# Patient Record
Sex: Female | Born: 1965 | Race: White | Hispanic: No | Marital: Single | Smoking: Never smoker
Health system: Southern US, Community
[De-identification: ages and names within clinical notes are randomized; demographics above are authoritative.]

## PROBLEM LIST (undated history)

## (undated) DIAGNOSIS — Q048 Other specified congenital malformations of brain: Secondary | ICD-10-CM

## (undated) DIAGNOSIS — E785 Hyperlipidemia, unspecified: Secondary | ICD-10-CM

## (undated) DIAGNOSIS — I2699 Other pulmonary embolism without acute cor pulmonale: Secondary | ICD-10-CM

## (undated) DIAGNOSIS — I272 Pulmonary hypertension, unspecified: Secondary | ICD-10-CM

## (undated) DIAGNOSIS — R569 Unspecified convulsions: Secondary | ICD-10-CM

## (undated) DIAGNOSIS — I5189 Other ill-defined heart diseases: Secondary | ICD-10-CM

## (undated) HISTORY — PX: HEMORRHOID SURGERY: SHX153

---

## 2012-04-11 ENCOUNTER — Ambulatory Visit (HOSPITAL_COMMUNITY): Admission: RE | Admit: 2012-04-11 | Payer: Self-pay | Source: Ambulatory Visit

## 2012-04-12 ENCOUNTER — Other Ambulatory Visit (HOSPITAL_COMMUNITY): Payer: Self-pay | Admitting: Family Medicine

## 2012-04-12 ENCOUNTER — Ambulatory Visit (HOSPITAL_COMMUNITY)
Admission: RE | Admit: 2012-04-12 | Discharge: 2012-04-12 | Disposition: A | Discharge status: Home / Self Care | Payer: Medicare Other | Source: Ambulatory Visit | Attending: Family Medicine | Admitting: Family Medicine

## 2012-04-12 DIAGNOSIS — R600 Localized edema: Secondary | ICD-10-CM

## 2012-04-12 DIAGNOSIS — M7989 Other specified soft tissue disorders: Secondary | ICD-10-CM | POA: Insufficient documentation

## 2012-04-12 DIAGNOSIS — M79609 Pain in unspecified limb: Secondary | ICD-10-CM | POA: Insufficient documentation

## 2012-04-12 DIAGNOSIS — M25561 Pain in right knee: Secondary | ICD-10-CM

## 2012-04-12 NOTE — Progress Notes (Signed)
VASCULAR LAB PRELIMINARY  PRELIMINARY  PRELIMINARY  PRELIMINARY  Right lower extremity venous Doppler completed.    Preliminary report:  There is no DVT or SVT noted in the right lower extremity.  Amber Warner, RVT 04/12/2012, 11:16 AM

## 2013-05-20 ENCOUNTER — Inpatient Hospital Stay (HOSPITAL_COMMUNITY)
Admission: EM | Admit: 2013-05-20 | Discharge: 2013-05-25 | DRG: 175 | Disposition: A | Discharge status: Home / Self Care | Payer: Medicare Other | Attending: Internal Medicine | Admitting: Internal Medicine

## 2013-05-20 ENCOUNTER — Encounter (HOSPITAL_COMMUNITY): Payer: Self-pay | Admitting: Emergency Medicine

## 2013-05-20 ENCOUNTER — Emergency Department (HOSPITAL_COMMUNITY): Payer: Medicare Other

## 2013-05-20 ENCOUNTER — Observation Stay (HOSPITAL_COMMUNITY): Payer: Medicare Other

## 2013-05-20 DIAGNOSIS — E785 Hyperlipidemia, unspecified: Secondary | ICD-10-CM | POA: Diagnosis present

## 2013-05-20 DIAGNOSIS — E876 Hypokalemia: Secondary | ICD-10-CM

## 2013-05-20 DIAGNOSIS — Z6841 Body Mass Index (BMI) 40.0 and over, adult: Secondary | ICD-10-CM | POA: Diagnosis not present

## 2013-05-20 DIAGNOSIS — I2699 Other pulmonary embolism without acute cor pulmonale: Principal | ICD-10-CM

## 2013-05-20 DIAGNOSIS — N390 Urinary tract infection, site not specified: Secondary | ICD-10-CM

## 2013-05-20 DIAGNOSIS — R079 Chest pain, unspecified: Secondary | ICD-10-CM | POA: Diagnosis present

## 2013-05-20 DIAGNOSIS — Z8249 Family history of ischemic heart disease and other diseases of the circulatory system: Secondary | ICD-10-CM

## 2013-05-20 DIAGNOSIS — R55 Syncope and collapse: Secondary | ICD-10-CM | POA: Diagnosis present

## 2013-05-20 DIAGNOSIS — J9600 Acute respiratory failure, unspecified whether with hypoxia or hypercapnia: Secondary | ICD-10-CM

## 2013-05-20 DIAGNOSIS — I2789 Other specified pulmonary heart diseases: Secondary | ICD-10-CM | POA: Diagnosis not present

## 2013-05-20 DIAGNOSIS — R748 Abnormal levels of other serum enzymes: Secondary | ICD-10-CM | POA: Diagnosis present

## 2013-05-20 DIAGNOSIS — I5189 Other ill-defined heart diseases: Secondary | ICD-10-CM

## 2013-05-20 DIAGNOSIS — I272 Pulmonary hypertension, unspecified: Secondary | ICD-10-CM

## 2013-05-20 DIAGNOSIS — Q048 Other specified congenital malformations of brain: Secondary | ICD-10-CM

## 2013-05-20 DIAGNOSIS — G40909 Epilepsy, unspecified, not intractable, without status epilepticus: Secondary | ICD-10-CM

## 2013-05-20 HISTORY — DX: Pulmonary hypertension, unspecified: I27.20

## 2013-05-20 HISTORY — DX: Other pulmonary embolism without acute cor pulmonale: I26.99

## 2013-05-20 HISTORY — DX: Unspecified convulsions: R56.9

## 2013-05-20 HISTORY — DX: Other specified congenital malformations of brain: Q04.8

## 2013-05-20 HISTORY — DX: Other ill-defined heart diseases: I51.89

## 2013-05-20 HISTORY — DX: Hyperlipidemia, unspecified: E78.5

## 2013-05-20 LAB — URINALYSIS, ROUTINE W REFLEX MICROSCOPIC
Bilirubin Urine: NEGATIVE
GLUCOSE, UA: NEGATIVE mg/dL
KETONES UR: NEGATIVE mg/dL
LEUKOCYTES UA: NEGATIVE
Nitrite: POSITIVE — AB
PH: 5.5 (ref 5.0–8.0)
PROTEIN: 30 mg/dL — AB
Specific Gravity, Urine: >1.03 — ABNORMAL HIGH (ref 1.005–1.030)
Urobilinogen, UA: 0.2 mg/dL (ref 0.0–1.0)

## 2013-05-20 LAB — TROPONIN I: TROPONIN I: 0.36 ng/mL — AB (ref <0.30)

## 2013-05-20 LAB — URINE MICROSCOPIC-ADD ON

## 2013-05-20 LAB — CBC WITH DIFFERENTIAL/PLATELET
Basophils Absolute: 0.1 10*3/uL (ref 0.0–0.1)
Basophils Relative: 1 % (ref 0–1)
EOS PCT: 2 % (ref 0–5)
Eosinophils Absolute: 0.2 10*3/uL (ref 0.0–0.7)
HCT: 44.2 % (ref 36.0–46.0)
Hemoglobin: 14.7 g/dL (ref 12.0–15.0)
LYMPHS ABS: 1.3 10*3/uL (ref 0.7–4.0)
LYMPHS PCT: 12 % (ref 12–46)
MCH: 32 pg (ref 26.0–34.0)
MCHC: 33.3 g/dL (ref 30.0–36.0)
MCV: 96.1 fL (ref 78.0–100.0)
Monocytes Absolute: 0.6 10*3/uL (ref 0.1–1.0)
Monocytes Relative: 6 % (ref 3–12)
NEUTROS ABS: 8.8 10*3/uL — AB (ref 1.7–7.7)
NEUTROS PCT: 79 % — AB (ref 43–77)
PLATELETS: 170 10*3/uL (ref 150–400)
RBC: 4.6 MIL/uL (ref 3.87–5.11)
RDW: 13 % (ref 11.5–15.5)
WBC: 11 10*3/uL — AB (ref 4.0–10.5)

## 2013-05-20 LAB — BASIC METABOLIC PANEL
BUN: 21 mg/dL (ref 6–23)
CHLORIDE: 106 meq/L (ref 96–112)
CO2: 24 meq/L (ref 19–32)
Calcium: 8.9 mg/dL (ref 8.4–10.5)
Creatinine, Ser: 0.65 mg/dL (ref 0.50–1.10)
GFR calc Af Amer: >90 mL/min (ref >90)
GFR calc non Af Amer: >90 mL/min (ref >90)
GLUCOSE: 129 mg/dL — AB (ref 70–99)
POTASSIUM: 4.1 meq/L (ref 3.7–5.3)
SODIUM: 144 meq/L (ref 137–147)

## 2013-05-20 LAB — LIPID PANEL
CHOLESTEROL: 200 mg/dL (ref 0–200)
HDL: 36 mg/dL — AB (ref >39)
LDL Cholesterol: 135 mg/dL — ABNORMAL HIGH (ref 0–99)
TRIGLYCERIDES: 144 mg/dL (ref <150)
Total CHOL/HDL Ratio: 5.6 RATIO
VLDL: 29 mg/dL (ref 0–40)

## 2013-05-20 LAB — CK TOTAL AND CKMB (NOT AT ARMC)
CK, MB: 14 ng/mL (ref 0.3–4.0)
Relative Index: 11.7 — ABNORMAL HIGH (ref 0.0–2.5)
Total CK: 120 U/L (ref 7–177)

## 2013-05-20 LAB — D-DIMER, QUANTITATIVE (NOT AT ARMC): D DIMER QUANT: 8.33 ug{FEU}/mL — AB (ref 0.00–0.48)

## 2013-05-20 LAB — CARBAMAZEPINE LEVEL, TOTAL: Carbamazepine Lvl: 10 ug/mL (ref 4.0–12.0)

## 2013-05-20 MED ORDER — ACETAMINOPHEN 650 MG RE SUPP
650.0000 mg | Freq: Four times a day (QID) | RECTAL | Status: DC | PRN
Start: 2013-05-20 — End: 2013-05-25

## 2013-05-20 MED ORDER — IOHEXOL 350 MG/ML SOLN
100.0000 mL | Freq: Once | INTRAVENOUS | Status: AC | PRN
  Administered 2013-05-20: 100 mL via INTRAVENOUS

## 2013-05-20 MED ORDER — CELECOXIB 100 MG PO CAPS
200.0000 mg | ORAL_CAPSULE | Freq: Two times a day (BID) | ORAL | Status: DC | PRN
  Administered 2013-05-21 – 2013-05-22 (×3): 200 mg via ORAL
  Filled 2013-05-20: qty 2
  Filled 2013-05-20: qty 1
  Filled 2013-05-20: qty 2

## 2013-05-20 MED ORDER — SODIUM CHLORIDE 0.9 % IV BOLUS (SEPSIS)
1000.0000 mL | Freq: Once | INTRAVENOUS | Status: AC
  Administered 2013-05-20: 1000 mL via INTRAVENOUS

## 2013-05-20 MED ORDER — ENOXAPARIN SODIUM 60 MG/0.6ML ~~LOC~~ SOLN
1.0000 mg/kg | Freq: Once | SUBCUTANEOUS | Status: AC
  Administered 2013-05-20: 110 mg via SUBCUTANEOUS
  Filled 2013-05-20: qty 1.2

## 2013-05-20 MED ORDER — SODIUM CHLORIDE 0.9 % IJ SOLN
3.0000 mL | Freq: Two times a day (BID) | INTRAMUSCULAR | Status: DC
  Administered 2013-05-23 – 2013-05-24 (×4): 3 mL via INTRAVENOUS

## 2013-05-20 MED ORDER — CARBAMAZEPINE 200 MG PO TABS
200.0000 mg | ORAL_TABLET | Freq: Four times a day (QID) | ORAL | Status: DC
  Administered 2013-05-20 – 2013-05-21 (×2): 200 mg via ORAL
  Administered 2013-05-21: 300 mg via ORAL
  Administered 2013-05-21 – 2013-05-25 (×16): 200 mg via ORAL
  Filled 2013-05-20 (×29): qty 1

## 2013-05-20 MED ORDER — ATORVASTATIN CALCIUM 40 MG PO TABS
80.0000 mg | ORAL_TABLET | Freq: Every day | ORAL | Status: DC
  Administered 2013-05-20 – 2013-05-24 (×5): 80 mg via ORAL
  Filled 2013-05-20 (×5): qty 2

## 2013-05-20 MED ORDER — POTASSIUM CHLORIDE CRYS ER 20 MEQ PO TBCR
20.0000 meq | EXTENDED_RELEASE_TABLET | Freq: Every day | ORAL | Status: DC
  Administered 2013-05-21 – 2013-05-25 (×5): 20 meq via ORAL
  Filled 2013-05-20 (×5): qty 1

## 2013-05-20 MED ORDER — CARBAMAZEPINE 100 MG/5ML PO SUSP
100.0000 mg | ORAL | Status: DC
  Administered 2013-05-21 – 2013-05-25 (×9): 100 mg via ORAL
  Filled 2013-05-20 (×14): qty 5

## 2013-05-20 MED ORDER — ENOXAPARIN SODIUM 60 MG/0.6ML ~~LOC~~ SOLN: 1.0000 mg/kg | Freq: Once | SUBCUTANEOUS | Status: DC

## 2013-05-20 MED ORDER — SODIUM CHLORIDE 0.9 % IV SOLN
INTRAVENOUS | Status: AC
  Administered 2013-05-21 (×2): via INTRAVENOUS

## 2013-05-20 MED ORDER — ENOXAPARIN SODIUM 40 MG/0.4ML ~~LOC~~ SOLN: 40.0000 mg | SUBCUTANEOUS | Status: DC

## 2013-05-20 MED ORDER — ACETAMINOPHEN 325 MG PO TABS
650.0000 mg | ORAL_TABLET | Freq: Four times a day (QID) | ORAL | Status: DC | PRN
  Administered 2013-05-21 – 2013-05-23 (×2): 650 mg via ORAL
  Filled 2013-05-20 (×3): qty 2

## 2013-05-20 MED ORDER — ASPIRIN EC 325 MG PO TBEC
325.0000 mg | DELAYED_RELEASE_TABLET | Freq: Every day | ORAL | Status: DC | PRN
  Administered 2013-05-21: 325 mg via ORAL
  Filled 2013-05-20: qty 1

## 2013-05-20 MED ORDER — ADULT MULTIVITAMIN W/MINERALS CH
1.0000 | ORAL_TABLET | Freq: Every day | ORAL | Status: DC
  Administered 2013-05-21 – 2013-05-25 (×5): 1 via ORAL
  Filled 2013-05-20 (×5): qty 1

## 2013-05-20 MED ORDER — SODIUM CHLORIDE 0.9 % IJ SOLN
INTRAMUSCULAR | Status: AC
  Filled 2013-05-20: qty 600

## 2013-05-20 MED ORDER — ASPIRIN EC 325 MG PO TBEC
325.0000 mg | DELAYED_RELEASE_TABLET | Freq: Every day | ORAL | Status: DC
  Administered 2013-05-20 – 2013-05-25 (×6): 325 mg via ORAL
  Filled 2013-05-20 (×6): qty 1

## 2013-05-20 NOTE — Progress Notes (Signed)
Radiologist called with results of CT, Pt has bilateral PEs. Covering hospitalist paged. Lipitor, aspirin, and lovenox given when patient arrived back to her room.

## 2013-05-20 NOTE — ED Notes (Signed)
cbg 114 per ems

## 2013-05-20 NOTE — ED Notes (Signed)
EMS reports was called out for CPR.  [facility] says pt was walking to her car with her mother, didn't feel good and collapsed in drive way.  EMS says when rescue squad arrived, pt's mother was administering CPR, pt appeared cyanotic, RR 3-4 and had weak pulse.  When EMS arrived pt was sitting up alert, talking, diaphoretic, and incontinent of urine.  BP initially was 100 palpated, ems reports bp was 148/77 and HR 110-120.  Reports pt has history of epilepsy and sunlight triggers her seizures.   Pt c/o mid back pain.  [facility] has chronic back pain but thinks aggravated it when she fell.

## 2013-05-20 NOTE — Progress Notes (Addendum)
Troponin came back at .36. Ddimer elevated over 8. CTA chest ordered. EKG f/up ordered with ? changes over earlier one today. Dr. Pearson GrippeJames Kim [facility] to relate lab/EKG results. He will take care of further orders.  Amber NormanKaren Kirby-Graham, NP [name] Update: CTA chest came back + for PE with right heart strain. Discussed with Dr. Selena BattenKim. Pt received one treatment dose of Lovenox and ASA per Dr. Selena BattenKim. Placed order for pharmacy consult for Lovenox dosing. Pt transferred to SDU for closer monitoring.  KJKG, NP

## 2013-05-20 NOTE — H&P (Signed)
Amber Warner is an 48 y.o. female.    Pcp:  Dr. Edrick Oh [facility], [place]) Full Code  Chief Complaint: syncope HPI: 48 yo female with seizure on Friday c/o " suction on her heart" left side of chest, and then this am felt a similar feeling, and then walking to the [place] and fell forward first and then backwards, per her mother she was sob, and blue.  Her mother initiated cpr on her.  This was not similar to prior seizure according to her mother. Cpr for about 57mnute.  Eyes were rolled back in her head per mother.  Then paramedics arrived and gave her o2 and she did better per her mother.     In ED, CT brain negative for any acute process.  EKG: nsr at 100, t inversion in V4-6, no old ekg for comparison.  Cardiac marker not yet resulted.  Pt will be admitted for syncope, and chestpain    Past Medical History  Diagnosis Date  . Seizures     Past Surgical History  Procedure Laterality Date  . Hemorrhoid surgery      Family History  Problem Relation Age of Onset  . Hypertension Mother   . Diabetes Mellitus II Maternal Aunt   . Stroke Father   . Heart attack Father 532   died at age 48  Social History:  reports that she has never smoked. She has never used smokeless tobacco. She reports that she does not drink alcohol or use illicit drugs.  Allergies: No Known Allergies  Medications Prior to Admission  Medication Sig Dispense Refill  . aspirin 500 MG EC tablet Take 500 mg by mouth daily as needed for pain.      . carBAMazepine (TEGRETOL) 100 MG/5ML suspension Take 100 mg by mouth 2 (two) times daily.      . carbamazepine (TEGRETOL) 200 MG tablet Take 200 mg by mouth 4 (four) times daily.      . celecoxib (CELEBREX) 200 MG capsule Take 200 mg by mouth 2 (two) times daily as needed for moderate pain.      . Multiple Vitamin (MULTIVITAMIN WITH MINERALS) TABS tablet Take 1 tablet by mouth daily.      . potassium chloride SA (K-DUR,KLOR-CON) 20 MEQ tablet Take 20 mEq by mouth daily.         Results for orders placed during the hospital encounter of 05/20/13 (from the past 48 hour(s))  CBC WITH DIFFERENTIAL     Status: Abnormal   Collection Time    05/20/13  1:51 PM      Result Value Ref Range   WBC 11.0 (*) 4.0 - 10.5 K/uL   RBC 4.60  3.87 - 5.11 MIL/uL   Hemoglobin 14.7  12.0 - 15.0 g/dL   HCT 44.2  36.0 - 46.0 %   MCV 96.1  78.0 - 100.0 fL   MCH 32.0  26.0 - 34.0 pg   MCHC 33.3  30.0 - 36.0 g/dL   RDW 13.0  11.5 - 15.5 %   Platelets 170  150 - 400 K/uL   Neutrophils Relative % 79 (*) 43 - 77 %   Neutro Abs 8.8 (*) 1.7 - 7.7 K/uL   Lymphocytes Relative 12  12 - 46 %   Lymphs Abs 1.3  0.7 - 4.0 K/uL   Monocytes Relative 6  3 - 12 %   Monocytes Absolute 0.6  0.1 - 1.0 K/uL   Eosinophils Relative 2  0 - 5 %  Eosinophils Absolute 0.2  0.0 - 0.7 K/uL   Basophils Relative 1  0 - 1 %   Basophils Absolute 0.1  0.0 - 0.1 K/uL  BASIC METABOLIC PANEL     Status: Abnormal   Collection Time    05/20/13  1:51 PM      Result Value Ref Range   Sodium 144  137 - 147 mEq/L   Potassium 4.1  3.7 - 5.3 mEq/L   Chloride 106  96 - 112 mEq/L   CO2 24  19 - 32 mEq/L   Glucose, Bld 129 (*) 70 - 99 mg/dL   BUN 21  6 - 23 mg/dL   Creatinine, Ser 0.65  0.50 - 1.10 mg/dL   Calcium 8.9  8.4 - 10.5 mg/dL   GFR calc non Af Amer >90  >90 mL/min   GFR calc Af Amer >90  >90 mL/min   Comment: (NOTE)     The eGFR has been calculated using the CKD EPI equation.     This calculation has not been validated in all clinical situations.     eGFR's persistently <90 mL/min signify possible Chronic Kidney     Disease.  CARBAMAZEPINE LEVEL, TOTAL     Status: None   Collection Time    05/20/13  1:51 PM      Result Value Ref Range   Carbamazepine Lvl 10.0  4.0 - 12.0 ug/mL   Ct Head Wo Contrast  05/20/2013   CLINICAL DATA:  Seizure, fall  EXAM: CT HEAD WITHOUT CONTRAST  TECHNIQUE: Contiguous axial images were obtained from the base of the skull through the vertex without intravenous contrast.   COMPARISON:  Prior CT from 07/01/2006.  Marland Kitchen  FINDINGS: Scattered and confluent hypodensity within the periventricular and deep white matter both cerebral hemispheres is consistent with mild chronic small vessel ischemic changes. Findings are most evident adjacent to the frontal horn of the right lateral ventricle. Increased nodularity along the margins of the lateral ventricles is suggestive of heterotopic gray matter (series 2, image 21).  There is no acute intracranial hemorrhage or infarct. No mass lesion or midline shift. Gray-white matter differentiation is well maintained. Ventricles are normal in size without evidence of hydrocephalus. CSF containing spaces are within normal limits. No extra-axial fluid collection.  The calvarium is intact.  Orbital soft tissues are within normal limits.  The paranasal sinuses and mastoid air cells are well pneumatized and free of fluid.  Scalp soft tissues are unremarkable.  IMPRESSION: 1. No acute intracranial abnormality. 2. 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 This finding is unchanged relative to prior CT from 06/30/2004. Follow-up examination with MRI would likely be helpful for further evaluation if further imaging is desired. 3. Moderate chronic small vessel ischemic disease.   Electronically Signed   By: Jeannine Boga M.D.   On: 05/20/2013 16:08   Dg Chest Portable 1 View  05/20/2013   CLINICAL DATA:  Loss of consciousness, patient's of breathing according to the mother, history of seizures  EXAM: PORTABLE CHEST - 1 VIEW  COMPARISON:  None.  FINDINGS: The heart size and mediastinal contours are within normal limits. There is prominence of the right hilum likely representing prominent pulmonary vasculature. Both lungs are clear. The visualized skeletal structures are unremarkable.  IMPRESSION: No active disease.   Electronically Signed   By: Kathreen Devoid    On: 05/20/2013 14:05    ROS  Negative for  all organ systems except for + above,  No tongue lac, no incontinence  Blood pressure 106/63, pulse 87, temperature 97.7 F (36.5 C), temperature source Oral, resp. rate 22, last menstrual period 04/24/2013, SpO2 95.00%. Physical Exam   Heent: anicteric, pupiles 1.79m symmetric, direct, consensual near intact,  Neck: no jvd, no bruit, no tm,  Heart: rrr s1, s2 Lung : ctab Abd: soft, obese, nt, nd, +bs,  Ext: no c/c/e Skin: no rash Lymph: no adenopathy Neuro: nonfocal Reflexes 2+ symmetric, direct consensual near intact    Assessment/Plan  Syncope MRI, EEG Carotid u/s, cardiac echo Check D dimer , if + then CTA chest Check ua  Chest pain Telemetry Cycle cardiac markers Stress testing  Seizure disorder Check cmp, cont tegretol  Hypokalemia Continue potassium supplementation  JJani Gravel4/27/2015, 8:03 PM

## 2013-05-20 NOTE — Progress Notes (Signed)
Lab called critical troponin, I paged [facility] night coverage. Pt denies chest pain, shortness of breath, or dizziness. Will page MD again.

## 2013-05-20 NOTE — ED Provider Notes (Signed)
CSN: [id number]     Arrival date & time 05/20/13  1321 History   This chart was scribed for Wilson SingerNimish C Gosrani, MD by Tana ConchStephen Methvin, ED Scribe. This patient was seen in room APA07/APA07 and the patient's care was started at 1:32 PM .    Chief Complaint  Patient presents with  . Loss of Consciousness      The history is provided by the patient and a parent. No language interpreter was used.    HPI Comments: Level V caveat for urgent need for intervention Amber Warner is a 48 y.o. female who presents to the Emergency Department complaining of LOC that happened this morning when she and her mother were going to get something to eat . Her mother states that she had to "grab the car as she could not breathe" .  The patient fell to the ground.   Her mother  had to perform "CPR" described as chest compression for approximately 1 minute before the pt regained consciousness. When the pt came to,  mother reports t pt had  shallow respirations and seemed confused.  She also reports  back pain.   Past medical history positive for a seizure disorder for which she takes Tegretol   LEVEL 5 CAVEAT CONFUSION  Past Medical History  Diagnosis Date  . Seizures    History reviewed. No pertinent past surgical history. No family history on file. History  Substance Use Topics  . Smoking status: Never Smoker   . Smokeless tobacco: Not on file  . Alcohol Use: No   OB History   Grav Para Term Preterm Abortions TAB SAB Ect Mult Living                 Review of Systems  Respiratory: Positive for shortness of breath.   Cardiovascular: Positive for chest pain.  Neurological: Positive for syncope and light-headedness.    A complete 10 system review of systems was obtained and all systems are negative except as noted in the HPI and PMH.    Allergies  Review of patient's allergies indicates no known allergies.  Home Medications   Prior to Admission medications   Not on File   BP 109/61   Pulse 113  Temp(Src) 97.7 F (36.5 C) (Oral)  Resp 21  SpO2 96%  LMP 04/24/2013 Physical Exam  Nursing note and vitals reviewed. Constitutional: She is oriented to person, place, and time. She appears well-developed and well-nourished.  HENT:  Head: Normocephalic and atraumatic.  Eyes: Conjunctivae and EOM are normal. Pupils are equal, round, and reactive to light.  Neck: Normal range of motion. Neck supple.  Cardiovascular: Normal rate, regular rhythm and normal heart sounds.   Pulmonary/Chest: Effort normal and breath sounds normal.  Abdominal: Soft. Bowel sounds are normal.  Musculoskeletal: Normal range of motion.  Neurological: She is alert and oriented to person, place, and time. No cranial nerve deficit. Coordination normal.  Skin: Skin is warm and dry.  Psychiatric: She has a normal mood and affect. Her behavior is normal.    ED Course  Procedures (including critical care time) ,DIAGNOSTIC STUDIES: Oxygen Saturation is 96% on RA, normal by my interpretation.    COORDINATION OF CARE:   1:38 PM-Discussed treatment plan which includes labs, pt will be admitted with pt at bedside and pt agreed to plan.   Labs Review Labs Reviewed  CBC WITH DIFFERENTIAL - Abnormal; Notable for the following:    WBC 11.0 (*)    Neutrophils Relative %  79 (*)    Neutro Abs 8.8 (*)    All other components within normal limits  BASIC METABOLIC PANEL - Abnormal; Notable for the following:    Glucose, Bld 129 (*)    All other components within normal limits  CARBAMAZEPINE LEVEL, TOTAL  URINALYSIS, ROUTINE W REFLEX MICROSCOPIC    Imaging Review Ct Head Wo Contrast  05/20/2013   CLINICAL DATA:  Seizure, fall  EXAM: CT HEAD WITHOUT CONTRAST  TECHNIQUE: Contiguous axial images were obtained from the base of the skull through the vertex without intravenous contrast.  COMPARISON:  Prior CT from 07/01/2006.  Marland Kitchen.  FINDINGS: Scattered and confluent hypodensity within the periventricular and deep  white matter both cerebral hemispheres is consistent with mild chronic small vessel ischemic changes. Findings are most evident adjacent to the frontal horn of the right lateral ventricle. Increased nodularity along the margins of the lateral ventricles is suggestive of heterotopic gray matter (series 2, image 21).  There is no acute intracranial hemorrhage or infarct. No mass lesion or midline shift. Gray-white matter differentiation is well maintained. Ventricles are normal in size without evidence of hydrocephalus. CSF containing spaces are within normal limits. No extra-axial fluid collection.  The calvarium is intact.  Orbital soft tissues are within normal limits.  The paranasal sinuses and mastoid air cells are well pneumatized and free of fluid.  Scalp soft tissues are unremarkable.  IMPRESSION: 1. No acute intracranial abnormality. 2. 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 This finding is unchanged relative to prior CT from 06/30/2004. Follow-up examination with MRI would likely be helpful for further evaluation if further imaging is desired. 3. Moderate chronic small vessel ischemic disease.   Electronically Signed   By: Rise MuBenjamin  McClintock M.D.   On: 05/20/2013 16:08   Dg Chest Portable 1 View  05/20/2013   CLINICAL DATA:  Loss of consciousness, patient's of breathing according to the mother, history of seizures  EXAM: PORTABLE CHEST - 1 VIEW  COMPARISON:  None.  FINDINGS: The heart size and mediastinal contours are within normal limits. There is prominence of the right hilum likely representing prominent pulmonary vasculature. Both lungs are clear. The visualized skeletal structures are unremarkable.  IMPRESSION: No active disease.   Electronically Signed   By: Elige KoHetal  Patel   On: 05/20/2013 14:05     EKG Interpretation   Date/Time:  Monday May 20 2013 14:00:14 EDT Ventricular Rate:  100 PR  Interval:  144 QRS Duration: 108 QT Interval:  388 QTC Calculation: 500 R Axis:   9 Text Interpretation:  Normal sinus rhythm Incomplete right bundle branch  block Anterior infarct , age undetermined T wave abnormality, consider  inferolateral ischemia Prolonged QT Abnormal ECG No previous ECGs  available Confirmed by Adriana SimasOOK  MD, Romone Shaff ([id number]) on 05/20/2013 3:23:31 PM      MDM   Final diagnoses:  Syncope   I personally performed the services described in this documentation, which was scribed in my presence. The recorded information has been reviewed and is accurate.   Uncertain etiology of syncopal spell and subsequent unresponsiveness. Patient has normal exam here. EKG, CT head, labs all normal. Telemetry    Donnetta HutchingBrian Kahmari Koller, MD 05/20/13 1810

## 2013-05-21 ENCOUNTER — Observation Stay (HOSPITAL_COMMUNITY)
Admit: 2013-05-21 | Discharge: 2013-05-21 | Disposition: A | Discharge status: Home / Self Care | Payer: Medicare Other | Attending: Internal Medicine | Admitting: Internal Medicine

## 2013-05-21 ENCOUNTER — Observation Stay (HOSPITAL_COMMUNITY): Payer: Medicare Other

## 2013-05-21 DIAGNOSIS — J9600 Acute respiratory failure, unspecified whether with hypoxia or hypercapnia: Secondary | ICD-10-CM | POA: Diagnosis present

## 2013-05-21 DIAGNOSIS — I369 Nonrheumatic tricuspid valve disorder, unspecified: Secondary | ICD-10-CM

## 2013-05-21 DIAGNOSIS — I2699 Other pulmonary embolism without acute cor pulmonale: Secondary | ICD-10-CM | POA: Diagnosis present

## 2013-05-21 HISTORY — DX: Other pulmonary embolism without acute cor pulmonale: I26.99

## 2013-05-21 LAB — CBC
HCT: 40.1 % (ref 36.0–46.0)
Hemoglobin: 13.3 g/dL (ref 12.0–15.0)
MCH: 32.1 pg (ref 26.0–34.0)
MCHC: 33.2 g/dL (ref 30.0–36.0)
MCV: 96.9 fL (ref 78.0–100.0)
Platelets: 152 10*3/uL (ref 150–400)
RBC: 4.14 MIL/uL (ref 3.87–5.11)
RDW: 13 % (ref 11.5–15.5)
WBC: 10.8 10*3/uL — ABNORMAL HIGH (ref 4.0–10.5)

## 2013-05-21 LAB — TROPONIN I
Troponin I: <0.3 ng/mL (ref <0.30)
Troponin I: <0.3 ng/mL (ref <0.30)

## 2013-05-21 LAB — MRSA PCR SCREENING: MRSA by PCR: NEGATIVE

## 2013-05-21 MED ORDER — PRO-STAT SUGAR FREE PO LIQD
30.0000 mL | Freq: Three times a day (TID) | ORAL | Status: DC
  Administered 2013-05-21 – 2013-05-25 (×11): 30 mL via ORAL
  Filled 2013-05-21 (×11): qty 30

## 2013-05-21 MED ORDER — DEXTROSE 5 % IV SOLN
1.0000 g | INTRAVENOUS | Status: DC
  Administered 2013-05-21 – 2013-05-24 (×4): 1 g via INTRAVENOUS
  Filled 2013-05-21 (×7): qty 10

## 2013-05-21 MED ORDER — ENOXAPARIN SODIUM 120 MG/0.8ML ~~LOC~~ SOLN
110.0000 mg | Freq: Two times a day (BID) | SUBCUTANEOUS | Status: DC
  Administered 2013-05-21 – 2013-05-22 (×4): 110 mg via SUBCUTANEOUS
  Administered 2013-05-23: 10:00:00 via SUBCUTANEOUS
  Filled 2013-05-21 (×7): qty 0.8

## 2013-05-21 NOTE — Progress Notes (Signed)
ANTICOAGULATION CONSULT NOTE - Initial Consult  Pharmacy Consult for Lovenox Indication: pulmonary embolus  No Known Allergies  Patient Measurements: Height: 5\' 2"  (157.5 cm) Weight: 245 lb 9.6 oz (111.403 kg) IBW/kg (Calculated) : 50.1  Vital Signs: Temp: 98 F (36.7 C) (04/28 0400) Temp src: Oral (04/28 0400) BP: 131/84 mmHg (04/28 0700) Pulse Rate: 97 (04/27 2102)  Labs:  Recent Labs  05/20/13 1351 05/21/13 0216  HGB 14.7  --   HCT 44.2  --   PLT 170  --   CREATININE 0.65  --   CKTOTAL 120  --   CKMB 14.0*  --   TROPONINI 0.36* <0.30    Estimated Creatinine Clearance: 102.4 ml/min (by C-G formula based on Cr of 0.65).   Medical History: Past Medical History  Diagnosis Date  . Seizures     Medications:  Scheduled:  . aspirin EC  325 mg Oral Daily  . atorvastatin  80 mg Oral q1800  . carBAMazepine  100 mg Oral 2 times per day  . carbamazepine  200 mg Oral 4 times per day  . multivitamin with minerals  1 tablet Oral Daily  . potassium chloride SA  20 mEq Oral Daily  . sodium chloride  3 mL Intravenous Q12H  . sodium chloride        Assessment: 48 yo F admitted with syncope & chest pain.  Chest CT + PE.  She was started on Lovenox.  No bleeding noted.  Renal function at patient's baseline.   Goal of Therapy:  Anti-Xa level 0.6-1 units/ml 4hrs after LMWH dose given Monitor platelets by anticoagulation protocol: Yes   Plan:  Lovenox 110mg  sq q12h CBC on MWF F/U long-term anticoagulation plans  Mercy Ridingndrea Taygen Crispin Vogel 05/21/2013,7:42 AM

## 2013-05-21 NOTE — Progress Notes (Addendum)
[name] PROGRESS NOTE  Amber Warner ZOX:[id number] DOB: Nov 22, 1965 DOA: 05/20/2013 PCP: Josue Hector, MD  Summary:  This patient was admitted to the hospital after having a syncopal episode. Patient's mother reports that patient became increasingly short of breath, complained of chest pain and then collapsed. Her mother reports that the patient was becoming cyanotic. Her mother then initiated chest compressions until EMS services arrived approximately 1 minute later. It does not appear that the patient ever truly lost her pulse. Oxygen was applied when EMS arrived and the patient began to improve. She was admitted to the hospital and found to have bilateral pulmonary emboli. She's been started on anticoagulation. Workup for PE has been initiated. She also has a seizure disorder and family reports multiple seizures, 2 seizures since her hospital admission. EEG has been ordered as has neurology consultation.  Assessment/Plan: 1. Bilateral pulmonary embolus. CT angiogram of the chest indicates bilateral pulmonary emboli. Patient is not on any hormonal treatments. Her family does report that she is very sedentary and mostly lays in bed. When the patient leaves her house, her family keeps her in a wheelchair despite her ability to ambulate. They are concerned that she may fall and injure herself after having a seizure. She does not have any prior history of thromboembolism. She's been started on Lovenox per pharmacy. Hypercoagulable panel will be sent. We will check venous Dopplers of lower extremities to rule out DVT. She'll likely benefit from being discharged on Xarelto or Eliquis. She does have evidence of right heart strain on CT. We will check echocardiogram. 2. Acute respiratory failure secondary to #1. Wean oxygen as tolerated. Incentive spirometry. 3. Mildly elevated troponin at 0.3. This is likely a demand ischemia from underlying PE. 4. Seizure disorder. Family reports the  patient has seizures on a daily basis. She takes Tegretol for seizures. She has been resistant in the past to change her antiepileptic regimen. He was reported that patient had 2 seizures while in the hospital since admission. The validity of these seizures is somewhat questionable. Will order EEG and neurology consult. The patient may benefit from adjustment/change in her antiepileptic regimen. 5. Syncope. Patient did become unresponsive after becoming short of breath and having chest pain in the field. Her mother reports that she collapsed and became unresponsive. Her mother began chest compressions until EMS arrived. She did not report check for a pulse prior to initiating chest compressions. After EMS arrival, oxygen was applied and patient became more awake and alert. It does not appear that she had a true cardiac arrest. 6. Possible UTI. Urinalysis indicates nitrite positive urine as well as RBCs in urine. We'll send urine culture and empirically start Rocephin. She also has a mild leukocytosis.  Code Status: full code Family Communication: discussed with patient and mother at the bedside Disposition Plan: discharge home once improved   Consultants:  Neurology  Procedures:    Antibiotics:  Rocephin 4/28  HPI/Subjective: Complains of shortness of breath, pain on deep inspiration and coughing  Objective: Filed Vitals:   05/21/13 1000  BP: 118/79  Pulse:   Temp:   Resp: 25    Intake/Output Summary (Last 24 hours) at 05/21/13 1137 Last data filed at 05/21/13 1000  Gross per 24 hour  Intake    780 ml  Output    400 ml  Net    380 ml   Filed Weights   05/20/13 2102 05/21/13 0045 05/21/13 0431  Weight: 110.7 kg (244 lb 0.8 oz) 111.8 kg (  246 lb 7.6 oz) 111.403 kg (245 lb 9.6 oz)    Exam:   General:  NAD, increased respiratory effort  Cardiovascular: S1, S2 tachycardic  Respiratory: CTA B  Abdomen: soft, obese, nt, bs+  Musculoskeletal: no edema b/l   Data  Reviewed: Basic Metabolic Panel:  Recent Labs Lab 05/20/13 1351  NA 144  K 4.1  CL 106  CO2 24  GLUCOSE 129*  BUN 21  CREATININE 0.65  CALCIUM 8.9   Liver Function Tests: No results found for this basename: AST, ALT, ALKPHOS, BILITOT, PROT, ALBUMIN,  in the last 168 hours No results found for this basename: LIPASE, AMYLASE,  in the last 168 hours No results found for this basename: AMMONIA,  in the last 168 hours CBC:  Recent Labs Lab 05/20/13 1351 05/21/13 0841  WBC 11.0* 10.8*  NEUTROABS 8.8*  --   HGB 14.7 13.3  HCT 44.2 40.1  MCV 96.1 96.9  PLT 170 152   Cardiac Enzymes:  Recent Labs Lab 05/20/13 1351 05/21/13 0216 05/21/13 0841  CKTOTAL 120  --   --   CKMB 14.0*  --   --   TROPONINI 0.36* <0.30 <0.30   BNP (last 3 results) No results found for this basename: PROBNP,  in the last 8760 hours CBG: No results found for this basename: GLUCAP,  in the last 168 hours  Recent Results (from the past 240 hour(s))  MRSA PCR SCREENING     Status: None   Collection Time    05/21/13  3:38 AM      Result Value Ref Range Status   MRSA by PCR NEGATIVE  NEGATIVE Final   Comment:            The GeneXpert MRSA Assay (FDA     approved for NASAL specimens     only), is one component of a     comprehensive MRSA colonization     surveillance program. It is not     intended to diagnose MRSA     infection nor to guide or     monitor treatment for     MRSA infections.     Studies: Ct Head Wo Contrast  05/20/2013   CLINICAL DATA:  Seizure, fall  EXAM: CT HEAD WITHOUT CONTRAST  TECHNIQUE: Contiguous axial images were obtained from the base of the skull through the vertex without intravenous contrast.  COMPARISON:  Prior CT from 07/01/2006.  Marland Kitchen.  FINDINGS: Scattered and confluent hypodensity within the periventricular and deep white matter both cerebral hemispheres is consistent with mild chronic small vessel ischemic changes. Findings are most evident adjacent to the frontal  horn of the right lateral ventricle. Increased nodularity along the margins of the lateral ventricles is suggestive of heterotopic gray matter (series 2, image 21).  There is no acute intracranial hemorrhage or infarct. No mass lesion or midline shift. Gray-white matter differentiation is well maintained. Ventricles are normal in size without evidence of hydrocephalus. CSF containing spaces are within normal limits. No extra-axial fluid collection.  The calvarium is intact.  Orbital soft tissues are within normal limits.  The paranasal sinuses and mastoid air cells are well pneumatized and free of fluid.  Scalp soft tissues are unremarkable.  IMPRESSION: 1. No acute intracranial abnormality. 2. 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 This finding is unchanged relative to prior CT from 06/30/2004. Follow-up examination with MRI would likely be helpful for further evaluation if  further imaging is desired. 3. Moderate chronic small vessel ischemic disease.   Electronically Signed   By: Rise Mu M.D.   On: 05/20/2013 16:08   Ct Angio Chest Pe W/cm &/or Wo Cm  05/20/2013   CLINICAL DATA:  Several day history of chest pain and shortness of breath now with hypoxia and positive D-dimer  EXAM: CT ANGIOGRAPHY CHEST WITH CONTRAST  TECHNIQUE: Multidetector CT imaging of the chest was performed using the standard protocol during bolus administration of intravenous contrast. Multiplanar CT image reconstructions and MIPs were obtained to evaluate the vascular anatomy.  CONTRAST:  OMNIPAQUE IOHEXOL 350 MG/ML SOLN  COMPARISON:  DG CHEST 1V PORT dated 05/20/2013  FINDINGS: There are filling defects within the right and left main pulmonary arteries at their proximal branches consistent with acute pulmonary emboli. There is dilation of the right ventricle with an abnormal RV-LV ratio of 1.6. There is no significant  pericardial effusion and no pleural effusion. There is no mediastinal or hilar lymphadenopathy. The caliber of the thoracic aorta is normal. There is no evidence of a false lumen.  At lung window settings very mildly increased interstitial density is present in both lungs. There is no alveolar infiltrate or findings to suggest pulmonary infarction. No pulmonary parenchymal nodules are demonstrated.  Within the upper abdomen there is an approximately 5 mm diameter calcification in the right hepatic lobe. The observed portions of the spleen and adrenal glands are grossly normal. The  Review of the MIP images confirms the above findings.  IMPRESSION: 1. There are new large volume bilateral pulmonary emboli. There is CTevidence of right heart strain (RV/LV Ratio = 1.6) consistent with at least submassive (intermediate risk) PE. The presence of right heart strain has been associated with an increased risk of morbidity and mortality. 2. There is no CT evidence of acute pulmonary infarction. 3. There is no pleural nor significant pericardial effusion. 4. These results were called by telephone at the time of interpretation on 05/20/2013 at 11:25 PM to Alfonse Flavors, RN, who verbally acknowledged these results.   Electronically Signed   By: David  [place]   On: 05/20/2013 23:28   US Carotid Bilateral  05/21/2013   CLINICAL DATA:  Syncope  EXAM: BILATERAL CAROTID DUPLEX ULTRASOUND  TECHNIQUE: Wallace Cullens scale imaging, color Doppler and duplex ultrasound were performed of bilateral carotid and vertebral arteries in the neck.  COMPARISON:  None.  FINDINGS: Criteria: Quantification of carotid stenosis is based on velocity parameters that correlate the residual internal carotid diameter with NASCET-based stenosis levels, using the diameter of the distal internal carotid lumen as the denominator for stenosis measurement.  The following velocity measurements were obtained:  RIGHT  ICA:  72/29 cm/sec  CCA:  51/11 cm/sec  SYSTOLIC ICA/CCA  RATIO:  1.41  DIASTOLIC ICA/CCA RATIO:  2.50  ECA:  97 cm/sec  LEFT  ICA:  68/22 cm/sec  CCA:  72/12 cm/sec  SYSTOLIC ICA/CCA RATIO:  0.94  DIASTOLIC ICA/CCA RATIO:  1.84  ECA:  65 cm/sec  RIGHT CAROTID ARTERY: Minor echogenic shadowing plaque formation. No hemodynamically significant right ICA stenosis, velocity elevation, or turbulent flow. Degree of narrowing less than 50%.  RIGHT VERTEBRAL ARTERY:  Antegrade  LEFT CAROTID ARTERY: Similar scattered minor echogenic plaque formation. No hemodynamically significant left ICA stenosis, velocity elevation, or turbulent flow.  LEFT VERTEBRAL ARTERY:  Antegrade  IMPRESSION: Minor carotid atherosclerosis. No hemodynamically significant ICA stenosis by ultrasound.   Electronically Signed   By: Sharen Counter.D.  On: 05/21/2013 11:11   Dg Chest Portable 1 View  05/20/2013   CLINICAL DATA:  Loss of consciousness, patient's of breathing according to the mother, history of seizures  EXAM: PORTABLE CHEST - 1 VIEW  COMPARISON:  None.  FINDINGS: The heart size and mediastinal contours are within normal limits. There is prominence of the right hilum likely representing prominent pulmonary vasculature. Both lungs are clear. The visualized skeletal structures are unremarkable.  IMPRESSION: No active disease.   Electronically Signed   By: Elige KoHetal  Patel   On: 05/20/2013 14:05    Scheduled Meds: . aspirin EC  325 mg Oral Daily  . atorvastatin  80 mg Oral q1800  . carBAMazepine  100 mg Oral 2 times per day  . carbamazepine  200 mg Oral 4 times per day  . cefTRIAXone (ROCEPHIN)  IV  1 g Intravenous Q24H  . enoxaparin (LOVENOX) injection  110 mg Subcutaneous Q12H  . multivitamin with minerals  1 tablet Oral Daily  . potassium chloride SA  20 mEq Oral Daily  . sodium chloride  3 mL Intravenous Q12H   Continuous Infusions: . sodium chloride 75 mL/hr at 05/21/13 1000    Principal Problem:   Bilateral pulmonary embolism Active Problems:   Syncope   Seizure disorder    Chest pain   Hypokalemia   Acute respiratory failure    Time spent: 40mins    [facility] Ver[facility] Regional Medical CenterJehanzeb Abhinav Mayorquin  [name] Pager [phone]. If 7PM-7AM, please contact night-coverage at [email or web address], password [facility] [email or web address] 05/21/2013, 11:37 AM  LOS: 1 day

## 2013-05-21 NOTE — Progress Notes (Signed)
UR Completed.  Jaelen Soth Jane Darrielle Pflieger [phone] 05/21/2013  

## 2013-05-21 NOTE — Progress Notes (Signed)
*  PRELIMINARY RESULTS* Echocardiogram 2D Echocardiogram has been performed.  Katheren PullerJohanna R Milia Warth 05/21/2013, 4:50 PM

## 2013-05-21 NOTE — Progress Notes (Signed)
INITIAL NUTRITION ASSESSMENT  DOCUMENTATION CODES Per approved criteria  -Morbid Obesity   INTERVENTION: 30 ml Prostat TID  NUTRITION DIAGNOSIS: Inadequate oral intake related to increased nutritional needs due to wound healing as evidenced by stage II pressure ulcer on sacrum.   Goal: Pt will meet >90% of estimated nutritional needs  Monitor:  PO intake, labs, skin assessments, weight changes, I/O's  Reason for Assessment: MST=4  [age] y.o. female  Admitting Dx: Bilateral pulmonary embolism  ASSESSMENT: Pt admitted with seizures and bilateral pulmonary embolism.  Hx provided by both pt and mother. Pt mother reports that pt has lost approximately 60# (19.7%) in 6 months. She reports concern about weight loss. Upon further questioning about diet and exercise regimen, pt mother reports pt is extremely sedentary ("all she wants to do is stay in the bed"), but is contemplating taking pt out walking at the local malls and parks for additional activity. Additionally, she reports home diet is very high in fat and calories ("we go out to eat a lot"), however, she has recently made modifications to pt's diet, including serving yogurt instead of ice cream and decreasing the amount of bread in pt's diet. Prior to changes, pt was mindlessly eating snack cakes throughout the day.  Pt appetite is very good both presently and PTA. Noted pt mother feeds pt and cues pt to chew and swallow. PO: 100%.  Brief exam revealed no depletion on temple, orbital, or clavicle areas.   Height: Ht Readings from Last 1 Encounters:  05/21/13 5\' 2"  (1.575 m)    Weight: Wt Readings from Last 1 Encounters:  05/21/13 245 lb 9.6 oz (111.403 kg)    Ideal Body Weight: 110#  % Ideal Body Weight: 222%  Wt Readings from Last 10 Encounters:  05/21/13 245 lb 9.6 oz (111.403 kg)    Usual Body Weight: 305#  % Usual Body Weight: 81%  BMI:  Body mass index is 44.91 kg/(m^2). Meets criteria for extreme obesity, class  III.  Estimated Nutritional Needs: Kcal: [id number] daily Protein: 111-140 grams daily Fluid: 1.9-2.2 L daily  Skin: stage II pressure ulcer on sacrum  Diet Order: Cardiac  EDUCATION NEEDS: -Education needs addressed   Intake/Output Summary (Last 24 hours) at 05/21/13 1417 Last data filed at 05/21/13 1000  Gross per 24 hour  Intake    780 ml  Output    400 ml  Net    380 ml    Last BM: 05/20/13  Labs:   Recent Labs Lab 05/20/13 1351  NA 144  K 4.1  CL 106  CO2 24  BUN 21  CREATININE 0.65  CALCIUM 8.9  GLUCOSE 129*    CBG (last 3)  No results found for this basename: GLUCAP,  in the last 72 hours  Scheduled Meds: . aspirin EC  325 mg Oral Daily  . atorvastatin  80 mg Oral q1800  . carBAMazepine  100 mg Oral 2 times per day  . carbamazepine  200 mg Oral 4 times per day  . cefTRIAXone (ROCEPHIN)  IV  1 g Intravenous Q24H  . enoxaparin (LOVENOX) injection  110 mg Subcutaneous Q12H  . multivitamin with minerals  1 tablet Oral Daily  . potassium chloride SA  20 mEq Oral Daily  . sodium chloride  3 mL Intravenous Q12H    Continuous Infusions: . sodium chloride 75 mL/hr at 05/21/13 1325    Past Medical History  Diagnosis Date  . Seizures     Past Surgical History  Procedure  Laterality Date  . Hemorrhoid surgery      Renise Gillies A. Mayford KnifeWilliams, RD, LDN Pager: [phone]

## 2013-05-21 NOTE — Progress Notes (Signed)
While we were talking to the patient and changing her gown, patient seemed to be alert and oriented and then she stopped talking and had a flat effect and begin trying to sit up, pull on her gown and telemetry leads. This semed to be a seizure which began at 0051 and lasted only 40 seconds approx. When patient seemed to be alert again I asked her was that what her seizures look like and she said yes. Checked vitals immediately after and patient seems fine as when she first got to floor. Patient mother at bedside. Helmut MusterAlicia RN

## 2013-05-21 NOTE — Progress Notes (Signed)
EEG Completed; Results Pending  

## 2013-05-22 ENCOUNTER — Encounter (HOSPITAL_COMMUNITY): Payer: Self-pay | Admitting: Internal Medicine

## 2013-05-22 DIAGNOSIS — N390 Urinary tract infection, site not specified: Secondary | ICD-10-CM | POA: Diagnosis present

## 2013-05-22 DIAGNOSIS — I272 Pulmonary hypertension, unspecified: Secondary | ICD-10-CM

## 2013-05-22 HISTORY — DX: Pulmonary hypertension, unspecified: I27.20

## 2013-05-22 LAB — LUPUS ANTICOAGULANT PANEL
DRVVT: 33.9 secs (ref <42.9)
Lupus Anticoagulant: NOT DETECTED
PTT Lupus Anticoagulant: 39.9 secs (ref 28.0–43.0)

## 2013-05-22 LAB — BASIC METABOLIC PANEL
BUN: 15 mg/dL (ref 6–23)
CO2: 21 mEq/L (ref 19–32)
Calcium: 8.1 mg/dL — ABNORMAL LOW (ref 8.4–10.5)
Chloride: 108 mEq/L (ref 96–112)
Creatinine, Ser: 0.49 mg/dL — ABNORMAL LOW (ref 0.50–1.10)
GFR calc Af Amer: >90 mL/min (ref >90)
GFR calc non Af Amer: >90 mL/min (ref >90)
Glucose, Bld: 112 mg/dL — ABNORMAL HIGH (ref 70–99)
Potassium: 4 mEq/L (ref 3.7–5.3)
Sodium: 142 mEq/L (ref 137–147)

## 2013-05-22 LAB — CARDIOLIPIN ANTIBODIES, IGG, IGM, IGA
ANTICARDIOLIPIN IGA: 6 U/mL — AB (ref <22)
ANTICARDIOLIPIN IGM: 1 [MPL'U]/mL — AB (ref <11)
Anticardiolipin IgG: 3 GPL U/mL — ABNORMAL LOW (ref <23)

## 2013-05-22 LAB — CBC
HCT: 38.5 % (ref 36.0–46.0)
Hemoglobin: 12.6 g/dL (ref 12.0–15.0)
MCH: 31.5 pg (ref 26.0–34.0)
MCHC: 32.7 g/dL (ref 30.0–36.0)
MCV: 96.3 fL (ref 78.0–100.0)
Platelets: 152 10*3/uL (ref 150–400)
RBC: 4 MIL/uL (ref 3.87–5.11)
RDW: 13 % (ref 11.5–15.5)
WBC: 9.6 10*3/uL (ref 4.0–10.5)

## 2013-05-22 LAB — PROTEIN S, TOTAL: Protein S Ag, Total: 74 % (ref 60–150)

## 2013-05-22 LAB — HOMOCYSTEINE: HOMOCYSTEINE-NORM: 7.1 umol/L (ref 4.0–15.4)

## 2013-05-22 LAB — PROTEIN C, TOTAL: Protein C, Total: 80 % (ref 72–160)

## 2013-05-22 LAB — BETA-2-GLYCOPROTEIN I ABS, IGG/M/A
BETA-2-GLYCOPROTEIN I IGA: 7 A Units (ref <20)
BETA-2-GLYCOPROTEIN I IGM: 6 M Units (ref <20)
Beta-2 Glyco I IgG: 12 G Units (ref <20)

## 2013-05-22 LAB — ANTITHROMBIN III: AntiThromb III Func: 96 % (ref 75–120)

## 2013-05-22 MED ORDER — LAMOTRIGINE 25 MG PO TABS
25.0000 mg | ORAL_TABLET | Freq: Two times a day (BID) | ORAL | Status: DC
  Administered 2013-05-22 – 2013-05-25 (×7): 25 mg via ORAL
  Filled 2013-05-22 (×11): qty 1

## 2013-05-22 MED ORDER — HYDROCODONE-ACETAMINOPHEN 5-325 MG PO TABS
1.0000 | ORAL_TABLET | Freq: Four times a day (QID) | ORAL | Status: DC | PRN
Start: 2013-05-22 — End: 2013-05-25
  Administered 2013-05-22 – 2013-05-25 (×9): 2 via ORAL
  Filled 2013-05-22 (×9): qty 2

## 2013-05-22 NOTE — Progress Notes (Signed)
[name] PROGRESS NOTE  Amber Warner ZOX:[id number] DOB: 09-Nov-1965 DOA: 05/20/2013 PCP: Josue Hector, MD  Summary:  This patient was admitted to the hospital after having a syncopal episode. Patient's mother reports that patient became increasingly short of breath, complained of chest pain and then collapsed. Her mother reports that the patient was becoming cyanotic. Her mother then initiated chest compressions until EMS services arrived approximately 1 minute later. It does not appear that the patient ever truly lost her pulse. Oxygen was applied when EMS arrived and the patient began to improve. She was admitted to the hospital and found to have bilateral pulmonary emboli. She's been started on anticoagulation. Workup for PE has been initiated. She also has a seizure disorder and family reports multiple seizures, 2 seizures since her hospital admission. EEG has been ordered as has neurology consultation.  Assessment/Plan: 1. Bilateral pulmonary embolus. CT angiogram of the chest indicates bilateral pulmonary emboli. Patient is not on any hormonal treatments. Her family does report that she is very sedentary and mostly lays in bed. When the patient leaves her house, her family keeps her in a wheelchair despite her ability to ambulate. They are concerned that she may fall and injure herself after having a seizure. She does not have any prior history of thromboembolism. She's been started on Lovenox per pharmacy. Hypercoagulable panel has been sent. Venous Dopplers of lower extremities are negative for DVT. She'll likely benefit from being discharged on Xarelto or Eliquis. She does have evidence of right heart strain on CT. 2. Severe pulmonary hypertension. This is likely the consequence of bilateral pulmonary emboli. This will be monitored. 3. Acute respiratory failure secondary to #1. Wean oxygen as tolerated. Incentive spirometry. 4. Mildly elevated troponin at 0.3. This is  likely a demand ischemia from underlying PE. Troponin I has normalized. 5. Seizure disorder. Family reports the patient has seizures on a daily basis. She takes Tegretol for seizures. She has been resistant in the past to change her antiepileptic regimen. Her mother reports several seizures during this hospitalization, but this has not been confirmed by nursing. The validity of these seizures is somewhat questionable. The patient may benefit from adjustment/change in her antiepileptic regimen. EEG and neurology consultation pending. 6. Syncope. Patient did become unresponsive after becoming short of breath and having chest pain in the field. Her mother reports that she collapsed and became unresponsive. Her mother began chest compressions until EMS arrived. She did not report check for a pulse prior to initiating chest compressions. After EMS arrival, oxygen was applied and patient became more awake and alert. It does not appear that she had a true cardiac arrest. Carotid ultrasound revealed no ICA stenosis. Neurologically, the patient appears to be without any deficits. 7. Possible UTI. Urinalysis indicates nitrite positive urine as well as RBCs in urine. Urine culture pending. Rocephin has been started. Her mild leukocytosis has resolved. We'll send urine culture and empirically start Rocephin.  8. Deconditioning. We'll asked for a physical therapy evaluation.  Code Status: full code Family Communication: discussed with patient and mother at the bedside Disposition Plan: discharge home once improved   Consultants:  Neurology  Procedures:  EEG pending 2-D echocardiogram:Study Conclusions - Left ventricle: The cavity size was normal. Systolic function was normal. The estimated ejection fraction was in the range of 50% to 55%. Wall motion was normal; there were no regional wall motion abnormalities. There was an increased relative contribution of atrial contraction to ventricular filling.  Doppler parameters are consistent with  abnormal left ventricular relaxation (grade 1 diastolic dysfunction). Mild to moderate concentric left ventricular hypertrophy. - Ventricular septum: The contour showed systolic flattening. These changes are consistent with RV pressure overload. - Aorta: Mild aortic root dilatation (4 cm). - Right ventricle: The cavity size was moderately dilated. Systolic function was moderately reduced. - Right atrium: The atrium was mildly dilated. - Tricuspid valve: Mildly dilated annulus. Normal leaflet thickness. Moderate regurgitation. - Pulmonary arteries: PA peak pressure: 80mm Hg (S). Severely elevated pulmonary pressures. - Inferior vena cava: The vessel was dilated; the respirophasic diameter changes were blunted (< 50%); findings are consistent with elevated central venous pressure. - Pericardium, extracardiac: A trivial pericardial effusion was identified.    Antibiotics:  Rocephin 4/28  HPI/Subjective: She complains of central pleuritic chest pain and sneezing. She is only short of breath when she tries to get up to the bedside commode. The mother reports some seizure activity overnight, but this has not been documented by nursing.  Objective: Filed Vitals:   05/22/13 0800  BP: 110/94  Pulse: 76  Temp:   Resp: 20   temperature 98.2.  Intake/Output Summary (Last 24 hours) at 05/22/13 0908 Last data filed at 05/21/13 1800  Gross per 24 hour  Intake   1370 ml  Output    100 ml  Net   1270 ml   Filed Weights   05/21/13 0045 05/21/13 0431 05/22/13 0500  Weight: 111.8 kg (246 lb 7.6 oz) 111.403 kg (245 lb 9.6 oz) 114.6 kg (252 lb 10.4 oz)    Exam:   General: Obese 48 year old Caucasian woman in no acute distress.  Cardiovascular: S1, S2, with no murmurs rubs or gallops.  Respiratory: Splinting, otherwise lungs are clear to auscultation bilaterally.  Abdomen: Soft obese, positive bowel sounds, nontender,  nondistended.  Musculoskeletal: No acute hot joints. No pedal edema.  Neurologic: She is alert and oriented x3. Cranial nerves II through XII are intact. Speech is clear.  Data Reviewed: Basic Metabolic Panel:  Recent Labs Lab 05/20/13 1351 05/22/13 0534  NA 144 142  K 4.1 4.0  CL 106 108  CO2 24 21  GLUCOSE 129* 112*  BUN 21 15  CREATININE 0.65 0.49*  CALCIUM 8.9 8.1*   Liver Function Tests: No results found for this basename: AST, ALT, ALKPHOS, BILITOT, PROT, ALBUMIN,  in the last 168 hours No results found for this basename: LIPASE, AMYLASE,  in the last 168 hours No results found for this basename: AMMONIA,  in the last 168 hours CBC:  Recent Labs Lab 05/20/13 1351 05/21/13 0841 05/22/13 0534  WBC 11.0* 10.8* 9.6  NEUTROABS 8.8*  --   --   HGB 14.7 13.3 12.6  HCT 44.2 40.1 38.5  MCV 96.1 96.9 96.3  PLT 170 152 152   Cardiac Enzymes:  Recent Labs Lab 05/20/13 1351 05/21/13 0216 05/21/13 0841  CKTOTAL 120  --   --   CKMB 14.0*  --   --   TROPONINI 0.36* <0.30 <0.30   BNP (last 3 results) No results found for this basename: PROBNP,  in the last 8760 hours CBG: No results found for this basename: GLUCAP,  in the last 168 hours  Recent Results (from the past 240 hour(s))  MRSA PCR SCREENING     Status: None   Collection Time    05/21/13  3:38 AM      Result Value Ref Range Status   MRSA by PCR NEGATIVE  NEGATIVE Final   Comment:  The GeneXpert MRSA Assay (FDA     approved for NASAL specimens     only), is one component of a     comprehensive MRSA colonization     surveillance program. It is not     intended to diagnose MRSA     infection nor to guide or     monitor treatment for     MRSA infections.     Studies: Ct Head Wo Contrast  05/20/2013   CLINICAL DATA:  Seizure, fall  EXAM: CT HEAD WITHOUT CONTRAST  TECHNIQUE: Contiguous axial images were obtained from the base of the skull through the vertex without intravenous contrast.   COMPARISON:  Prior CT from 07/01/2006.  Marland Kitchen  FINDINGS: Scattered and confluent hypodensity within the periventricular and deep white matter both cerebral hemispheres is consistent with mild chronic small vessel ischemic changes. Findings are most evident adjacent to the frontal horn of the right lateral ventricle. Increased nodularity along the margins of the lateral ventricles is suggestive of heterotopic gray matter (series 2, image 21).  There is no acute intracranial hemorrhage or infarct. No mass lesion or midline shift. Gray-white matter differentiation is well maintained. Ventricles are normal in size without evidence of hydrocephalus. CSF containing spaces are within normal limits. No extra-axial fluid collection.  The calvarium is intact.  Orbital soft tissues are within normal limits.  The paranasal sinuses and mastoid air cells are well pneumatized and free of fluid.  Scalp soft tissues are unremarkable.  IMPRESSION: 1. No acute intracranial abnormality. 2. 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 This finding is unchanged relative to prior CT from 06/30/2004. Follow-up examination with MRI would likely be helpful for further evaluation if further imaging is desired. 3. Moderate chronic small vessel ischemic disease.   Electronically Signed   By: Rise Mu M.D.   On: 05/20/2013 16:08   Ct Angio Chest Pe W/cm &/or Wo Cm  05/20/2013   CLINICAL DATA:  Several day history of chest pain and shortness of breath now with hypoxia and positive D-dimer  EXAM: CT ANGIOGRAPHY CHEST WITH CONTRAST  TECHNIQUE: Multidetector CT imaging of the chest was performed using the standard protocol during bolus administration of intravenous contrast. Multiplanar CT image reconstructions and MIPs were obtained to evaluate the vascular anatomy.  CONTRAST:  OMNIPAQUE IOHEXOL 350 MG/ML SOLN  COMPARISON:  DG CHEST 1V  PORT dated 05/20/2013  FINDINGS: There are filling defects within the right and left main pulmonary arteries at their proximal branches consistent with acute pulmonary emboli. There is dilation of the right ventricle with an abnormal RV-LV ratio of 1.6. There is no significant pericardial effusion and no pleural effusion. There is no mediastinal or hilar lymphadenopathy. The caliber of the thoracic aorta is normal. There is no evidence of a false lumen.  At lung window settings very mildly increased interstitial density is present in both lungs. There is no alveolar infiltrate or findings to suggest pulmonary infarction. No pulmonary parenchymal nodules are demonstrated.  Within the upper abdomen there is an approximately 5 mm diameter calcification in the right hepatic lobe. The observed portions of the spleen and adrenal glands are grossly normal. The  Review of the MIP images confirms the above findings.  IMPRESSION: 1. There are new large volume bilateral pulmonary emboli. There is CTevidence of right heart strain (RV/LV Ratio = 1.6) consistent with at least submassive (intermediate risk) PE. The presence of right heart  strain has been associated with an increased risk of morbidity and mortality. 2. There is no CT evidence of acute pulmonary infarction. 3. There is no pleural nor significant pericardial effusion. 4. These results were called by telephone at the time of interpretation on 05/20/2013 at 11:25 PM to Alfonse Flavorsaroline Webb, RN, who verbally acknowledged these results.   Electronically Signed   By: David  SwazilandJordan   On: 05/20/2013 23:28   [place] Carotid Bilateral  05/21/2013   CLINICAL DATA:  Syncope  EXAM: BILATERAL CAROTID DUPLEX ULTRASOUND  TECHNIQUE: Wallace CullensGray scale imaging, color Doppler and duplex ultrasound were performed of bilateral carotid and vertebral arteries in the neck.  COMPARISON:  None.  FINDINGS: Criteria: Quantification of carotid stenosis is based on velocity parameters that correlate the residual  internal carotid diameter with NASCET-based stenosis levels, using the diameter of the distal internal carotid lumen as the denominator for stenosis measurement.  The following velocity measurements were obtained:  RIGHT  ICA:  72/29 cm/sec  CCA:  51/11 cm/sec  SYSTOLIC ICA/CCA RATIO:  1.41  DIASTOLIC ICA/CCA RATIO:  2.50  ECA:  97 cm/sec  LEFT  ICA:  68/22 cm/sec  CCA:  72/12 cm/sec  SYSTOLIC ICA/CCA RATIO:  0.94  DIASTOLIC ICA/CCA RATIO:  1.84  ECA:  65 cm/sec  RIGHT CAROTID ARTERY: Minor echogenic shadowing plaque formation. No hemodynamically significant right ICA stenosis, velocity elevation, or turbulent flow. Degree of narrowing less than 50%.  RIGHT VERTEBRAL ARTERY:  Antegrade  LEFT CAROTID ARTERY: Similar scattered minor echogenic plaque formation. No hemodynamically significant left ICA stenosis, velocity elevation, or turbulent flow.  LEFT VERTEBRAL ARTERY:  Antegrade  IMPRESSION: Minor carotid atherosclerosis. No hemodynamically significant ICA stenosis by ultrasound.   Electronically Signed   By: Ruel Favorsrevor  Shick M.D.   On: 05/21/2013 11:11   [place] Venous Img Lower Bilateral  05/21/2013   CLINICAL DATA:  Syncope  EXAM: Bilateral LOWER EXTREMITY VENOUS DUPLEX ULTRASOUND  TECHNIQUE: Doppler venous assessment of the bilateral lower extremity deep venous system was performed, including characterization of spectral flow, compressibility, and phasicity.  COMPARISON:  None.  FINDINGS: There is complete compressibility of the bilateral common femoral, femoral, and popliteal veins. Doppler analysis demonstrates respiratory phasicity and augmentation of flow upon calf compression. No obvious superficial vein or calf vein thrombosis.  IMPRESSION: No evidence of lower extremity DVT.   Electronically Signed   By: Maryclare BeanArt  Hoss M.D.   On: 05/21/2013 16:22   Dg Chest Portable 1 View  05/20/2013   CLINICAL DATA:  Loss of consciousness, patient's of breathing according to the mother, history of seizures  EXAM: PORTABLE  CHEST - 1 VIEW  COMPARISON:  None.  FINDINGS: The heart size and mediastinal contours are within normal limits. There is prominence of the right hilum likely representing prominent pulmonary vasculature. Both lungs are clear. The visualized skeletal structures are unremarkable.  IMPRESSION: No active disease.   Electronically Signed   By: Elige KoHetal  Patel   On: 05/20/2013 14:05    Scheduled Meds: . aspirin EC  325 mg Oral Daily  . atorvastatin  80 mg Oral q1800  . carBAMazepine  100 mg Oral 2 times per day  . carbamazepine  200 mg Oral 4 times per day  . cefTRIAXone (ROCEPHIN)  IV  1 g Intravenous Q24H  . enoxaparin (LOVENOX) injection  110 mg Subcutaneous Q12H  . feeding supplement (PRO-STAT SUGAR FREE 64)  30 mL Oral TID WC  . multivitamin with minerals  1 tablet Oral Daily  . potassium  chloride SA  20 mEq Oral Daily  . sodium chloride  3 mL Intravenous Q12H   Continuous Infusions:    Principal Problem:   Bilateral pulmonary embolism Active Problems:   Syncope   Seizure disorder   Chest pain   Hypokalemia   Acute respiratory failure   Pulmonary emboli   Pulmonary hypertension   Morbid obesity   UTI (urinary tract infection)    Time spent: 40mins    Elliot Cousinenise Tilia Faso  [name] Pager [phone]. If 7PM-7AM, please contact night-coverage at [email or web address], password [email or web address] 05/22/2013, 9:08 AM  LOS: 2 days

## 2013-05-22 NOTE — Consult Note (Addendum)
Laurel A. Merlene Laughter, MD     [email or web address]          Amber Warner is an 48 y.o. female.   ASSESSMENT/PLAN: 1. 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 The heterotopia likely represents the culprit causing the epilepsy syndrome. However, there is a strong family history of epilepsy raising the suspicion of genetically determined epilepsy syndromes. Given that the patient's seizures are now well controlled, additional medications will be given. I think we will start the patient on Lamictal and titrate to efficacy. EEG will be obtained. For now, we'll continue with the Tegretol.  2. Pulmonary embolism.  3. Syncope likely due to pulmonary embolism.  This is a 48 year old white female who has a long-standing history of seizures. The patient's seizures started since the patient was a child proximally 64 years old. There is a strong family history of paternal epilepsy with several members on her father's side have an epilepsy syndromes. The history is obtained from the patient's mother and the patient. The mother is not very good historian. However, from what I can ascertain appears that the patient's has been having quite frequent seizures. The seizures are mostly small seizures with the patient having automatism involving the hands and the speaking throughout the spell. She is apparently less responsive but not totally unresponsive during the spells. It typically lasts only for a couple seconds. The mother reports that she has been having more of these spells since being hospitalized. Apparently right flashing lights precipitate these spells. She typically stays at home with the lights down. Patient also has what appears to be grand mal seizures per the description although mother described these grand mal seizures as a patient falling backwards and  becoming unresponsive for several minutes. It does not necessarily appears to be tonoclonic activities. Sometimes she becomes incontinent of urine. The patient developed a spell where she became acutely short of breath and developed significant chest pain and presyncope with the patient becoming blue and required CPR. The workup has been significant for an elevated d-dimer and the positive palmar embolism documented by CT angiography of the chest. The patient did report having some headache and the dizziness with the current spell. She is feeling much better although she still has some headaches. The mother reports that the patient did well through school and there is no evidence of development of delay or cognitive impairment. She graduated from high school and she last possible to what the state universities. However, she had to turn it down because of difficulties with frequent seizures and loosen her privileges to drive. The mother reports that she was driving and working hard time before her seizures got bad. She essentially has been staying with her mother ever since.  GENERAL:  Pleasant obese lady who is in no acute distress.  HEENT: Supple. Atraumatic normocephalic.  Bilateral esotropia.  ABDOMEN: soft  EXTREMITIES: No edema   BACK: Normal.  SKIN: Normal by inspection.    MENTAL STATUS: Alert and oriented. Speech, language and cognition are generally intact. Judgment and insight good.   CRANIAL NERVES: Pupils are equal, round and reactive to light and accommodation; extra ocular movements are full, there is no significant nystagmus; visual fields are full; upper and lower facial muscles are normal in strength and symmetric, there is no flattening of the nasolabial folds; tongue is midline; uvula is midline; shoulder elevation is normal.  MOTOR:  Normal tone, bulk and strength; no pronator drift.  COORDINATION: Left finger to nose is normal, right finger to nose is normal, No rest tremor;  no intention tremor; no postural tremor; no bradykinesia.  REFLEXES: Deep tendon reflexes are symmetrical and normal. Babinski reflexes are flexor bilaterally.   SENSATION: Normal to light touch.      Past Medical History  Diagnosis Date  . Seizures   . Pulmonary hypertension 05/22/2013  . Bilateral pulmonary embolism 05/21/2013    Past Surgical History  Procedure Laterality Date  . Hemorrhoid surgery      Family History  Problem Relation Age of Onset  . Hypertension Mother   . Diabetes Mellitus II Maternal Aunt   . Stroke Father   . Heart attack Father 38    died at age 53    Social History:  reports that she has never smoked. She has never used smokeless tobacco. She reports that she does not drink alcohol or use illicit drugs.  Allergies: No Known Allergies  Medications: Prior to Admission medications   Medication Sig Start Date End Date Taking? Authorizing Provider  aspirin 500 MG EC tablet Take 500 mg by mouth daily as needed for pain.   Yes Historical Provider, MD  carBAMazepine (TEGRETOL) 100 MG/5ML suspension Take 100 mg by mouth 2 (two) times daily.   Yes Historical Provider, MD  carbamazepine (TEGRETOL) 200 MG tablet Take 200 mg by mouth 4 (four) times daily.   Yes Historical Provider, MD  celecoxib (CELEBREX) 200 MG capsule Take 200 mg by mouth 2 (two) times daily as needed for moderate pain.   Yes Historical Provider, MD  Multiple Vitamin (MULTIVITAMIN WITH MINERALS) TABS tablet Take 1 tablet by mouth daily.   Yes Historical Provider, MD  potassium chloride SA (K-DUR,KLOR-CON) 20 MEQ tablet Take 20 mEq by mouth daily.   Yes Historical Provider, MD    Scheduled Meds: . aspirin EC  325 mg Oral Daily  . atorvastatin  80 mg Oral q1800  . carBAMazepine  100 mg Oral 2 times per day  . carbamazepine  200 mg Oral 4 times per day  . cefTRIAXone (ROCEPHIN)  IV  1 g Intravenous Q24H  . enoxaparin (LOVENOX) injection  110 mg Subcutaneous Q12H  . feeding supplement  (PRO-STAT SUGAR FREE 64)  30 mL Oral TID WC  . multivitamin with minerals  1 tablet Oral Daily  . potassium chloride SA  20 mEq Oral Daily  . sodium chloride  3 mL Intravenous Q12H   Continuous Infusions:  PRN Meds:.acetaminophen, acetaminophen, aspirin EC, celecoxib   Blood pressure 110/94, pulse 76, temperature 98.2 F (36.8 C), temperature source Oral, resp. rate 20, height _0  (1.575 m), weight 114.6 kg (252 lb 10.4 oz), last menstrual period 04/24/2013, SpO2 95.00%.   Results for orders placed during the hospital encounter of 05/20/13 (from the past 48 hour(s))  CBC WITH DIFFERENTIAL     Status: Abnormal   Collection Time    05/20/13  1:51 PM      Result Value Ref Range   WBC 11.0 (*) 4.0 - 10.5 K/uL   RBC 4.60  3.87 - 5.11 MIL/uL   Hemoglobin 14.7  12.0 - 15.0 g/dL   HCT 44.2  36.0 - 46.0 %   MCV 96.1  78.0 - 100.0 fL   MCH 32.0  26.0 - 34.0 pg   MCHC 33.3  30.0 - 36.0 g/dL   RDW 13.0  11.5 - 15.5 %   Platelets 170  150 - 400 K/uL   Neutrophils Relative % 79 (*) 43 - 77 %   Neutro Abs 8.8 (*) 1.7 - 7.7 K/uL   Lymphocytes Relative 12  12 - 46 %   Lymphs Abs 1.3  0.7 - 4.0 K/uL   Monocytes Relative 6  3 - 12 %   Monocytes Absolute 0.6  0.1 - 1.0 K/uL   Eosinophils Relative 2  0 - 5 %   Eosinophils Absolute 0.2  0.0 - 0.7 K/uL   Basophils Relative 1  0 - 1 %   Basophils Absolute 0.1  0.0 - 0.1 K/uL  BASIC METABOLIC PANEL     Status: Abnormal   Collection Time    05/20/13  1:51 PM      Result Value Ref Range   Sodium 144  137 - 147 mEq/L   Potassium 4.1  3.7 - 5.3 mEq/L   Chloride 106  96 - 112 mEq/L   CO2 24  19 - 32 mEq/L   Glucose, Bld 129 (*) 70 - 99 mg/dL   BUN 21  6 - 23 mg/dL   Creatinine, Ser 0.65  0.50 - 1.10 mg/dL   Calcium 8.9  8.4 - 10.5 mg/dL   GFR calc non Af Amer >90  >90 mL/min   GFR calc Af Amer >90  >90 mL/min   Comment: (NOTE)     The eGFR has been calculated using the CKD EPI equation.     This calculation has not been validated in all  clinical situations.     eGFR's persistently <90 mL/min signify possible Chronic Kidney     Disease.  CARBAMAZEPINE LEVEL, TOTAL     Status: None   Collection Time    05/20/13  1:51 PM      Result Value Ref Range   Carbamazepine Lvl 10.0  4.0 - 12.0 ug/mL  TROPONIN I     Status: Abnormal   Collection Time    05/20/13  1:51 PM      Result Value Ref Range   Troponin I 0.36 (*) <0.30 ng/mL   Comment:            Due to the release kinetics of cTnI,     a negative result within the first hours     of the onset of symptoms does not rule out     myocardial infarction with certainty.     If myocardial infarction is still suspected,     repeat the test at appropriate intervals.     CRITICAL RESULT CALLED TO, READ BACK BY AND VERIFIED WITH:     WEBB,C ON 05/20/13 AT 2105 BY LOY,C  D-DIMER, QUANTITATIVE     Status: Abnormal   Collection Time    05/20/13  1:51 PM      Result Value Ref Range   D-Dimer, Quant 8.33 (*) 0.00 - 0.48 ug/mL-FEU   Comment:            AT THE INHOUSE ESTABLISHED CUTOFF     VALUE OF 0.48 ug/mL FEU,     THIS ASSAY HAS BEEN DOCUMENTED     IN THE LITERATURE TO HAVE     A SENSITIVITY AND NEGATIVE     PREDICTIVE VALUE OF AT LEAST     98 TO 99%.  THE TEST RESULT     SHOULD BE CORRELATED WITH     AN ASSESSMENT OF THE CLINICAL     PROBABILITY OF DVT / VTE.  CK TOTAL AND CKMB  Status: Abnormal   Collection Time    05/20/13  1:51 PM      Result Value Ref Range   Total CK 120  7 - 177 U/L   CK, MB 14.0 (*) 0.3 - 4.0 ng/mL   Comment: CRITICAL RESULT CALLED TO, READ BACK BY AND VERIFIED WITH:     K FORSYTH,MLT @ [facility] [id number] [address]     CRITICAL RESULT CALLED TO, READ BACK BY AND VERIFIED WITH:     WEBB C AT 2342 ON [id number] BY FORSYTH K   Relative Index 11.7 (*) 0.0 - 2.5   Comment: Performed at [facility]     Status: Abnormal   Collection Time    05/20/13 10:00 PM      Result Value Ref Range   Color, Urine  AMBER (*) YELLOW   Comment: BIOCHEMICALS MAY BE AFFECTED BY COLOR   APPearance HAZY (*) CLEAR   Specific Gravity, Urine >1.030 (*) 1.005 - 1.030   pH 5.5  5.0 - 8.0   Glucose, UA NEGATIVE  NEGATIVE mg/dL   Hgb urine dipstick LARGE (*) NEGATIVE   Bilirubin Urine NEGATIVE  NEGATIVE   Ketones, ur NEGATIVE  NEGATIVE mg/dL   Protein, ur 30 (*) NEGATIVE mg/dL   Urobilinogen, UA 0.2  0.0 - 1.0 mg/dL   Nitrite POSITIVE (*) NEGATIVE   Leukocytes, UA NEGATIVE  NEGATIVE  URINE MICROSCOPIC-ADD ON     Status: Abnormal   Collection Time    05/20/13 10:00 PM      Result Value Ref Range   Squamous Epithelial / LPF MANY (*) RARE   WBC, UA 0-2  <3 WBC/hpf   RBC / HPF TOO NUMEROUS TO COUNT  <3 RBC/hpf   Bacteria, UA MANY (*) RARE   Urine-Other AMORPHOUS URATES/PHOSPHATES     Comment: MUCOUS PRESENT  LIPID PANEL     Status: Abnormal   Collection Time    05/20/13 10:14 PM      Result Value Ref Range   Cholesterol 200  0 - 200 mg/dL   Triglycerides 144  <150 mg/dL   HDL 36 (*) >39 mg/dL   Total CHOL/HDL Ratio 5.6     VLDL 29  0 - 40 mg/dL   LDL Cholesterol 135 (*) 0 - 99 mg/dL   Comment:            Total Cholesterol/HDL:CHD Risk     Coronary Heart Disease Risk Table                         Men   Women      1/2 Average Risk   3.4   3.3      Average Risk       5.0   4.4      2 X Average Risk   9.6   7.1      3 X Average Risk  23.4   11.0                Use the calculated Patient Ratio     above and the CHD Risk Table     to determine the patient's CHD Risk.                ATP III CLASSIFICATION (LDL):      <100     mg/dL   Optimal      100-129  mg/dL   Near or Above  Optimal      130-159  mg/dL   Borderline      160-189  mg/dL   High      >190     mg/dL   Very High  TROPONIN I     Status: None   Collection Time    05/21/13  2:16 AM      Result Value Ref Range   Troponin I <0.30  <0.30 ng/mL   Comment:            Due to the release kinetics of cTnI,     a  negative result within the first hours     of the onset of symptoms does not rule out     myocardial infarction with certainty.     If myocardial infarction is still suspected,     repeat the test at appropriate intervals.  MRSA PCR SCREENING     Status: None   Collection Time    05/21/13  3:38 AM      Result Value Ref Range   MRSA by PCR NEGATIVE  NEGATIVE   Comment:            The GeneXpert MRSA Assay (FDA     approved for NASAL specimens     only), is one component of a     comprehensive MRSA colonization     surveillance program. It is not     intended to diagnose MRSA     infection nor to guide or     monitor treatment for     MRSA infections.  TROPONIN I     Status: None   Collection Time    05/21/13  8:41 AM      Result Value Ref Range   Troponin I <0.30  <0.30 ng/mL   Comment:            Due to the release kinetics of cTnI,     a negative result within the first hours     of the onset of symptoms does not rule out     myocardial infarction with certainty.     If myocardial infarction is still suspected,     repeat the test at appropriate intervals.  CBC     Status: Abnormal   Collection Time    05/21/13  8:41 AM      Result Value Ref Range   WBC 10.8 (*) 4.0 - 10.5 K/uL   RBC 4.14  3.87 - 5.11 MIL/uL   Hemoglobin 13.3  12.0 - 15.0 g/dL   HCT 40.1  36.0 - 46.0 %   MCV 96.9  78.0 - 100.0 fL   MCH 32.1  26.0 - 34.0 pg   MCHC 33.2  30.0 - 36.0 g/dL   RDW 13.0  11.5 - 15.5 %   Platelets 152  150 - 400 K/uL  ANTITHROMBIN III     Status: None   Collection Time    05/21/13  3:40 PM      Result Value Ref Range   AntiThromb III Func 96  75 - 120 %   Comment: Performed at [facility]     Status: None   Collection Time    05/21/13  3:40 PM      Result Value Ref Range   Homocysteine 7.1  4.0 - 15.4 umol/L   Comment: Performed at [facility]     Status: None   Collection Time    05/22/13  5:34  AM      Result Value Ref Range    WBC 9.6  4.0 - 10.5 K/uL   RBC 4.00  3.87 - 5.11 MIL/uL   Hemoglobin 12.6  12.0 - 15.0 g/dL   HCT 38.5  36.0 - 46.0 %   MCV 96.3  78.0 - 100.0 fL   MCH 31.5  26.0 - 34.0 pg   MCHC 32.7  30.0 - 36.0 g/dL   RDW 13.0  11.5 - 15.5 %   Platelets 152  150 - 400 K/uL  BASIC METABOLIC PANEL     Status: Abnormal   Collection Time    05/22/13  5:34 AM      Result Value Ref Range   Sodium 142  137 - 147 mEq/L   Potassium 4.0  3.7 - 5.3 mEq/L   Chloride 108  96 - 112 mEq/L   CO2 21  19 - 32 mEq/L   Glucose, Bld 112 (*) 70 - 99 mg/dL   BUN 15  6 - 23 mg/dL   Creatinine, Ser 0.49 (*) 0.50 - 1.10 mg/dL   Calcium 8.1 (*) 8.4 - 10.5 mg/dL   GFR calc non Af Amer >90  >90 mL/min   GFR calc Af Amer >90  >90 mL/min   Comment: (NOTE)     The eGFR has been calculated using the CKD EPI equation.     This calculation has not been validated in all clinical situations.     eGFR's persistently <90 mL/min signify possible Chronic Kidney     Disease.    Ct Head Wo Contrast  05/20/2013   CLINICAL DATA:  Seizure, fall  EXAM: CT HEAD WITHOUT CONTRAST  TECHNIQUE: Contiguous axial images were obtained from the base of the skull through the vertex without intravenous contrast.  COMPARISON:  Prior CT from 07/01/2006.  Marland Kitchen  FINDINGS: Scattered and confluent hypodensity within the periventricular and deep white matter both cerebral hemispheres is consistent with mild chronic small vessel ischemic changes. Findings are most evident adjacent to the frontal horn of the right lateral ventricle. Increased nodularity along the margins of the lateral ventricles is suggestive of heterotopic gray matter (series 2, image 21).  There is no acute intracranial hemorrhage or infarct. No mass lesion or midline shift. Gray-white matter differentiation is well maintained. Ventricles are normal in size without evidence of hydrocephalus. CSF containing spaces are within normal limits. No extra-axial fluid collection.  The calvarium is intact.   Orbital soft tissues are within normal limits.  The paranasal sinuses and mastoid air cells are well pneumatized and free of fluid.  Scalp soft tissues are unremarkable.  IMPRESSION: 1. No acute intracranial abnormality. 2. 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 This finding is unchanged relative to prior CT from 06/30/2004. Follow-up examination with MRI would likely be helpful for further evaluation if further imaging is desired. 3. Moderate chronic small vessel ischemic disease.   Electronically Signed   By: Jeannine Boga M.D.   On: 05/20/2013 16:08   Ct Angio Chest Pe W/cm &/or Wo Cm  05/20/2013   CLINICAL DATA:  Several day history of chest pain and shortness of breath now with hypoxia and positive D-dimer  EXAM: CT ANGIOGRAPHY CHEST WITH CONTRAST  TECHNIQUE: Multidetector CT imaging of the chest was performed using the standard protocol during bolus administration of intravenous contrast. Multiplanar CT image reconstructions and MIPs were obtained to evaluate the vascular anatomy.  CONTRAST:  172m OMNIPAQUE IOHEXOL 350 MG/ML SOLN  COMPARISON:  DG CHEST 1V PORT dated 05/20/2013  FINDINGS: There are filling defects within the right and left main pulmonary arteries at their proximal branches consistent with acute pulmonary emboli. There is dilation of the right ventricle with an abnormal RV-LV ratio of 1.6. There is no significant pericardial effusion and no pleural effusion. There is no mediastinal or hilar lymphadenopathy. The caliber of the thoracic aorta is normal. There is no evidence of a false lumen.  At lung window settings very mildly increased interstitial density is present in both lungs. There is no alveolar infiltrate or findings to suggest pulmonary infarction. No pulmonary parenchymal nodules are demonstrated.  Within the upper abdomen there is an approximately 5 mm diameter  calcification in the right hepatic lobe. The observed portions of the spleen and adrenal glands are grossly normal. The  Review of the MIP images confirms the above findings.  IMPRESSION: 1. There are new large volume bilateral pulmonary emboli. There is CTevidence of right heart strain (RV/LV Ratio = 1.6) consistent with at least submassive (intermediate risk) PE. The presence of right heart strain has been associated with an increased risk of morbidity and mortality. 2. There is no CT evidence of acute pulmonary infarction. 3. There is no pleural nor significant pericardial effusion. 4. These results were called by telephone at the time of interpretation on 05/20/2013 at 11:25 PM to CTawni Pummel RN, who verbally acknowledged these results.   Electronically Signed   By: David  JMartinique  On: 05/20/2013 23:28   UKoreaCarotid Bilateral  05/21/2013   CLINICAL DATA:  Syncope  EXAM: BILATERAL CAROTID DUPLEX ULTRASOUND  TECHNIQUE: GPearline Cablesscale imaging, color Doppler and duplex ultrasound were performed of bilateral carotid and vertebral arteries in the neck.  COMPARISON:  None.  FINDINGS: Criteria: Quantification of carotid stenosis is based on velocity parameters that correlate the residual internal carotid diameter with NASCET-based stenosis levels, using the diameter of the distal internal carotid lumen as the denominator for stenosis measurement.  The following velocity measurements were obtained:  RIGHT  ICA:  72/29 cm/sec  CCA:  522/29cm/sec  SYSTOLIC ICA/CCA RATIO:  17.98 DIASTOLIC ICA/CCA RATIO:  29.21 ECA:  97 cm/sec  LEFT  ICA:  68/22 cm/sec  CCA:  719/41cm/sec  SYSTOLIC ICA/CCA RATIO:  07.40 DIASTOLIC ICA/CCA RATIO:  18.14 ECA:  65 cm/sec  RIGHT CAROTID ARTERY: Minor echogenic shadowing plaque formation. No hemodynamically significant right ICA stenosis, velocity elevation, or turbulent flow. Degree of narrowing less than 50%.  RIGHT VERTEBRAL ARTERY:  Antegrade  LEFT CAROTID ARTERY: Similar scattered minor  echogenic plaque formation. No hemodynamically significant left ICA stenosis, velocity elevation, or turbulent flow.  LEFT VERTEBRAL ARTERY:  Antegrade  IMPRESSION: Minor carotid atherosclerosis. No hemodynamically significant ICA stenosis by ultrasound.   Electronically Signed   By: TDaryll BrodM.D.   On: 05/21/2013 11:11   UKoreaVenous Img Lower Bilateral  05/21/2013   CLINICAL DATA:  Syncope  EXAM: Bilateral LOWER EXTREMITY VENOUS DUPLEX ULTRASOUND  TECHNIQUE: Doppler venous assessment of the bilateral lower extremity deep venous system was performed, including characterization of spectral flow, compressibility, and phasicity.  COMPARISON:  None.  FINDINGS: There is complete compressibility of the bilateral common femoral, femoral, and popliteal veins. Doppler analysis demonstrates respiratory phasicity and augmentation of flow upon calf compression. No obvious superficial vein or calf vein thrombosis.  IMPRESSION: No evidence of lower extremity DVT.   Electronically Signed  By: Maryclare Bean M.D.   On: 05/21/2013 16:22   Dg Chest Portable 1 View  05/20/2013   CLINICAL DATA:  Loss of consciousness, patient's of breathing according to the mother, history of seizures  EXAM: PORTABLE CHEST - 1 VIEW  COMPARISON:  None.  FINDINGS: The heart size and mediastinal contours are within normal limits. There is prominence of the right hilum likely representing prominent pulmonary vasculature. Both lungs are clear. The visualized skeletal structures are unremarkable.  IMPRESSION: No active disease.   Electronically Signed   By: Kathreen Devoid   On: 05/20/2013 14:05    Head CT scan is reviewed in person and shows extensive nodular findings and the lateral ventricles bilaterally. The findings are concerning for heterotopia. There is also cerebellar atrophy diffusely consistent with extensive use of antiepileptic medications. It is also evidence of verbal atrophy which is marked.    Leisa Gault A. Merlene Laughter, M.D.  Diplomate,  Tax adviser of Psychiatry and Neurology ( Neurology). 05/22/2013, 8:56 AM

## 2013-05-22 NOTE — Progress Notes (Signed)
PT SITTING UP IN BED AND EYES GLAZED OVER.PT LEANED FORWARD AND TO THE LEFT,REACHING OUT CLENCHING AT THE AIR.Marland Kitchen.THEN SHE SWAYED BACK AND TO THE RIGHT. EYES ALSO PULLED UP AND TO THE RIGHT. THIS LASTED APROXIMENTLY 60 SEC. THEN PT ALERT AND COHERRANT. NO INCONTINENCE

## 2013-05-22 NOTE — Procedures (Signed)
  [facility] Amber Casique A. Gerilyn Pilgrimoonquah, MD     [email or web address]           HISTORY: This is a 48 year old lady has a known history of epilepsy. She reports having more seizures since being hospitalized.  MEDICATIONS: Scheduled Meds: . aspirin EC  325 mg Oral Daily  . atorvastatin  80 mg Oral q1800  . carBAMazepine  100 mg Oral 2 times per day  . carbamazepine  200 mg Oral 4 times per day  . cefTRIAXone (ROCEPHIN)  IV  1 g Intravenous Q24H  . enoxaparin (LOVENOX) injection  110 mg Subcutaneous Q12H  . feeding supplement (PRO-STAT SUGAR FREE 64)  30 mL Oral TID WC  . lamoTRIgine  25 mg Oral BID  . multivitamin with minerals  1 tablet Oral Daily  . potassium chloride SA  20 mEq Oral Daily  . sodium chloride  3 mL Intravenous Q12H   Continuous Infusions:  PRN Meds:.acetaminophen, acetaminophen, aspirin EC, celecoxib  Prior to Admission medications   Medication Sig Start Date End Date Taking? Authorizing Provider  aspirin 500 MG EC tablet Take 500 mg by mouth daily as needed for pain.   Yes Historical Provider, MD  carBAMazepine (TEGRETOL) 100 MG/5ML suspension Take 100 mg by mouth 2 (two) times daily.   Yes Historical Provider, MD  carbamazepine (TEGRETOL) 200 MG tablet Take 200 mg by mouth 4 (four) times daily.   Yes Historical Provider, MD  celecoxib (CELEBREX) 200 MG capsule Take 200 mg by mouth 2 (two) times daily as needed for moderate pain.   Yes Historical Provider, MD  Multiple Vitamin (MULTIVITAMIN WITH MINERALS) TABS tablet Take 1 tablet by mouth daily.   Yes Historical Provider, MD  potassium chloride SA (K-DUR,KLOR-CON) 20 MEQ tablet Take 20 mEq by mouth daily.   Yes Historical Provider, MD      ANALYSIS: A 16 channel recording using standard 10 20 measurements is conducted for Approximately 20 minutes. There is a well-formed posterior dominant rhythm of 9 Hz which attenuates with eye opening. There is beta activity observed in frontal areas. Awake and drowsy  activities are recorded. For this ablation was carried out without significant changes in the background activity. There is no focal or lateralized slowing. The patient is noted to have several sharp wave activity with phase reverses at T3. No electrographic seizures are observed.   IMPRESSION: 1. 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      Yoseph Haile A. Gerilyn Warner, M.D.  Diplomate, Biomedical engineerAmerican Board of Psychiatry and Neurology ( Neurology).

## 2013-05-22 NOTE — Progress Notes (Signed)
[facility] Pt tolerated well. No seizure activity noted. Pt NSR on telemetry. Oxygen at 2 liters per [place] intact. No acute distress noted at this time.

## 2013-05-23 ENCOUNTER — Inpatient Hospital Stay (HOSPITAL_COMMUNITY): Payer: Medicare Other

## 2013-05-23 ENCOUNTER — Encounter (HOSPITAL_COMMUNITY): Payer: Self-pay | Admitting: Radiology

## 2013-05-23 LAB — URINE CULTURE: Colony Count: >=100000

## 2013-05-23 MED ORDER — MORPHINE SULFATE 2 MG/ML IJ SOLN
2.0000 mg | INTRAMUSCULAR | Status: DC | PRN
  Administered 2013-05-23 – 2013-05-24 (×3): 2 mg via INTRAVENOUS
  Filled 2013-05-23 (×3): qty 1

## 2013-05-23 MED ORDER — ENOXAPARIN SODIUM 120 MG/0.8ML ~~LOC~~ SOLN
120.0000 mg | Freq: Two times a day (BID) | SUBCUTANEOUS | Status: DC
  Administered 2013-05-23 – 2013-05-24 (×2): 120 mg via SUBCUTANEOUS
  Filled 2013-05-23 (×8): qty 0.8

## 2013-05-23 MED ORDER — SODIUM CHLORIDE 0.9 % IV SOLN
INTRAVENOUS | Status: DC
  Administered 2013-05-23 – 2013-05-24 (×2): via INTRAVENOUS

## 2013-05-23 MED ORDER — BENZONATATE 100 MG PO CAPS
100.0000 mg | ORAL_CAPSULE | Freq: Three times a day (TID) | ORAL | Status: DC | PRN
  Administered 2013-05-23 – 2013-05-24 (×3): 100 mg via ORAL
  Filled 2013-05-23 (×3): qty 1

## 2013-05-23 MED ORDER — IOHEXOL 300 MG/ML  SOLN
80.0000 mL | Freq: Once | INTRAMUSCULAR | Status: AC | PRN
  Administered 2013-05-23: 80 mL via INTRAVENOUS

## 2013-05-23 NOTE — Progress Notes (Signed)
PT WAS FINISHING SUPPER WHEN SHE HAD A SEIZURE. FELL FORWARD TO THE LEFT  W/  HANDS GRABBING THE AIR. THEN FELL BACK TO THE RIGHT W/ EYES ROLLING BACK TO THE UPPER RIGHT. IMMEDIATELY RECOVERED AND WAS ALERT AND COHERRANT.

## 2013-05-23 NOTE — Progress Notes (Signed)
Patient ID: Amber Warner, female   DOB: 15-Jun-1965, 48 y.o.   MRN: [id number]  [place] A. Merlene Laughter, MD     [email or web address]          Amber Warner is an 48 y.o. female.   Assessment/Plan: 1. 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 The heterotopia likely represents the culprit causing the epilepsy syndrome. However, there is a strong family history of epilepsy raising the suspicion of genetically determined epilepsy syndromes. Given that the patient's seizures are now well controlled, additional medications will be given. I think we will start the patient on Lamictal and titrate to efficacy. We'll continue with both medications. The limit told be titrated gradually to prevent side effects. For now, we'll continue with the Tegretol.  2. Pulmonary embolism.  3. Syncope likely due to pulmonary embolism.  4. Chest pain thought likely to be due to the patient's pulmonary embolism.  Patient only had one spell overnight. This lasted about 60 seconds. She leaned forward was somewhat unresponsive and leaned backwards with eyes rolled back. The may been some automatism but no tonic-clonic activity. No urinary incontinence.  GENERAL: Pleasant obese lady who is in no acute distress.  HEENT: Supple. Atraumatic normocephalic. Bilateral esotropia.  ABDOMEN: soft  EXTREMITIES: No edema  BACK: Normal.  SKIN: Normal by inspection.  MENTAL STATUS: Alert and oriented. Speech, language and cognition are generally intact. Judgment and insight good.  CRANIAL NERVES: Pupils are equal, round and reactive to light and accommodation; extra ocular movements are full, there is no significant nystagmus; visual fields are full; upper and lower facial muscles are normal in strength and symmetric, there is no flattening of the nasolabial folds; tongue is midline; uvula  is midline; shoulder elevation is normal.  MOTOR: Normal tone, bulk and strength; no pronator drift.  COORDINATION: Left finger to nose is normal, right finger to nose is normal, No rest tremor; no intention tremor; no postural tremor; no bradykinesia.   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    Objective: Vital signs in last 24 hours: Temp:  [97.5 F (36.4 C)-98.6 F (37 C)] 98 F (36.7 C) (04/30 0400) Pulse Rate:  [77-90] 80 (04/30 0800) Resp:  [12-26] 13 (04/30 0800) BP: (108-142)/(64-106) 108/83 mmHg (04/30 0800) SpO2:  [84 %-100 %] 96 % (04/30 0800) Weight:  [120.2 kg (264 lb 15.9 oz)] 120.2 kg (264 lb 15.9 oz) (04/30 0500)  Intake/Output from previous day: 04/29 0701 - 04/30 0700 In: 1010 [P.O.:960; IV Piggyback:50] Out: 802 [Urine:800; Stool:2] Intake/Output this shift:   Nutritional status: Cardiac   Lab Results: Results for orders placed during the hospital encounter of 05/20/13 (from the past 48 hour(s))  TROPONIN I     Status: None   Collection Time    05/21/13  8:41 AM      Result Value Ref Range   Troponin I <0.30  <0.30 ng/mL   Comment:            Due to the release kinetics of cTnI,     a negative result within the first hours     of the onset of symptoms does not rule out     myocardial infarction with certainty.     If myocardial infarction is still suspected,     repeat the test at appropriate intervals.  CBC  Status: Abnormal   Collection Time    05/21/13  8:41 AM      Result Value Ref Range   WBC 10.8 (*) 4.0 - 10.5 K/uL   RBC 4.14  3.87 - 5.11 MIL/uL   Hemoglobin 13.3  12.0 - 15.0 g/dL   HCT 40.1  36.0 - 46.0 %   MCV 96.9  78.0 - 100.0 fL   MCH 32.1  26.0 - 34.0 pg   MCHC 33.2  30.0 - 36.0 g/dL   RDW 13.0  11.5 - 15.5 %   Platelets 152  150 - 400 K/uL  ANTITHROMBIN III     Status: None   Collection Time    05/21/13  3:40 PM      Result Value Ref Range    AntiThromb III Func 96  75 - 120 %   Comment: Performed at [place], TOTAL     Status: None   Collection Time    05/21/13  3:40 PM      Result Value Ref Range   Protein C, Total 80  72 - 160 %   Comment: ** Please note change in reference range(s). **     Performed at [place], TOTAL     Status: None   Collection Time    05/21/13  3:40 PM      Result Value Ref Range   Protein S Ag, Total 74  60 - 150 %   Comment: Performed at [place]     Status: None   Collection Time    05/21/13  3:40 PM      Result Value Ref Range   PTT Lupus Anticoagulant 39.9  28.0 - 43.0 secs   PTTLA Confirmation NOT APPL  <8.0 secs   PTTLA 4:1 Mix NOT APPL  28.0 - 43.0 secs   Drvvt 33.9  <42.9 secs   Drvvt confirmation NOT APPL  <1.11 Ratio   dRVVT Incubated 1:1 Mix NOT APPL  <42.9 secs   Lupus Anticoagulant NOT DETECTED  NOT DETECTED   Comment: Performed at [facility]  BETA-2-GLYCOPROTEIN I ABS, IGG/M/A     Status: None   Collection Time    05/21/13  3:40 PM      Result Value Ref Range   Beta-2 Glyco I IgG 12  <20 G Units   Beta-2-Glycoprotein I IgM 6  <20 M Units   Beta-2-Glycoprotein I IgA 7  <20 A Units   Comment: Performed at [place]     Status: None   Collection Time    05/21/13  3:40 PM      Result Value Ref Range   Homocysteine 7.1  4.0 - 15.4 umol/L   Comment: Performed at [place], IGG, IGM, IGA     Status: Abnormal   Collection Time    05/21/13  3:40 PM      Result Value Ref Range   Anticardiolipin IgG 3 (*) <23 GPL U/mL   Anticardiolipin IgM 1 (*) <11 MPL U/mL   Anticardiolipin IgA 6 (*) <22 APL U/mL   Comment: (NOTE)     Reference Range:  Cardiolipin IgG       Normal                  <23       Low Positive (+)        23-35  Moderate Positive (+)   36-50       High Positive (+)       >50     Reference Range:   Cardiolipin IgM       Normal                  <11       Low Positive (+)        11-20       Moderate Positive (+)   21-30       High Positive (+)       >30     Reference Range:  Cardiolipin IgA       Normal                  <22       Low Positive (+)        22-35       Moderate Positive (+)   36-45       High Positive (+)       >45     Performed at [facility]  CBC     Status: None   Collection Time    05/22/13  5:34 AM      Result Value Ref Range   WBC 9.6  4.0 - 10.5 K/uL   RBC 4.00  3.87 - 5.11 MIL/uL   Hemoglobin 12.6  12.0 - 15.0 g/dL   HCT 38.5  36.0 - 46.0 %   MCV 96.3  78.0 - 100.0 fL   MCH 31.5  26.0 - 34.0 pg   MCHC 32.7  30.0 - 36.0 g/dL   RDW 13.0  11.5 - 15.5 %   Platelets 152  150 - 400 K/uL  BASIC METABOLIC PANEL     Status: Abnormal   Collection Time    05/22/13  5:34 AM      Result Value Ref Range   Sodium 142  137 - 147 mEq/L   Potassium 4.0  3.7 - 5.3 mEq/L   Chloride 108  96 - 112 mEq/L   CO2 21  19 - 32 mEq/L   Glucose, Bld 112 (*) 70 - 99 mg/dL   BUN 15  6 - 23 mg/dL   Creatinine, Ser 0.49 (*) 0.50 - 1.10 mg/dL   Calcium 8.1 (*) 8.4 - 10.5 mg/dL   GFR calc non Af Amer >90  >90 mL/min   GFR calc Af Amer >90  >90 mL/min   Comment: (NOTE)     The eGFR has been calculated using the CKD EPI equation.     This calculation has not been validated in all clinical situations.     eGFR's persistently <90 mL/min signify possible Chronic Kidney     Disease.    Lipid Panel  Recent Labs  05/20/13 2214  CHOL 200  TRIG 144  HDL 36*  CHOLHDL 5.6  VLDL 29  LDLCALC 135*    Studies/Results: US Carotid Bilateral  05/21/2013   CLINICAL DATA:  Syncope  EXAM: BILATERAL CAROTID DUPLEX ULTRASOUND  TECHNIQUE: Pearline Cables scale imaging, color Doppler and duplex ultrasound were performed of bilateral carotid and vertebral arteries in the neck.  COMPARISON:  None.  FINDINGS: Criteria: Quantification of carotid stenosis is based on velocity parameters that  correlate the residual internal carotid diameter with NASCET-based stenosis levels, using the diameter of the distal internal carotid lumen as the denominator for stenosis measurement.  The following velocity measurements were obtained:  RIGHT  ICA:  72/29  cm/sec  CCA:  30/86 cm/sec  SYSTOLIC ICA/CCA RATIO:  5.78  DIASTOLIC ICA/CCA RATIO:  4.69  ECA:  97 cm/sec  LEFT  ICA:  68/22 cm/sec  CCA:  62/95 cm/sec  SYSTOLIC ICA/CCA RATIO:  2.84  DIASTOLIC ICA/CCA RATIO:  1.32  ECA:  65 cm/sec  RIGHT CAROTID ARTERY: Minor echogenic shadowing plaque formation. No hemodynamically significant right ICA stenosis, velocity elevation, or turbulent flow. Degree of narrowing less than 50%.  RIGHT VERTEBRAL ARTERY:  Antegrade  LEFT CAROTID ARTERY: Similar scattered minor echogenic plaque formation. No hemodynamically significant left ICA stenosis, velocity elevation, or turbulent flow.  LEFT VERTEBRAL ARTERY:  Antegrade  IMPRESSION: Minor carotid atherosclerosis. No hemodynamically significant ICA stenosis by ultrasound.   Electronically Signed   By: Daryll Brod M.D.   On: 05/21/2013 11:11   US Venous Img Lower Bilateral  05/21/2013   CLINICAL DATA:  Syncope  EXAM: Bilateral LOWER EXTREMITY VENOUS DUPLEX ULTRASOUND  TECHNIQUE: Doppler venous assessment of the bilateral lower extremity deep venous system was performed, including characterization of spectral flow, compressibility, and phasicity.  COMPARISON:  None.  FINDINGS: There is complete compressibility of the bilateral common femoral, femoral, and popliteal veins. Doppler analysis demonstrates respiratory phasicity and augmentation of flow upon calf compression. No obvious superficial vein or calf vein thrombosis.  IMPRESSION: No evidence of lower extremity DVT.   Electronically Signed   By: Maryclare Bean M.D.   On: 05/21/2013 16:22    Medications:  Scheduled Meds: . aspirin EC  325 mg Oral Daily  . atorvastatin  80 mg Oral q1800  . carBAMazepine  100 mg Oral 2 times per  day  . carbamazepine  200 mg Oral 4 times per day  . cefTRIAXone (ROCEPHIN)  IV  1 g Intravenous Q24H  . enoxaparin (LOVENOX) injection  110 mg Subcutaneous Q12H  . feeding supplement (PRO-STAT SUGAR FREE 64)  30 mL Oral TID WC  . lamoTRIgine  25 mg Oral BID  . multivitamin with minerals  1 tablet Oral Daily  . potassium chloride SA  20 mEq Oral Daily  . sodium chloride  3 mL Intravenous Q12H   Continuous Infusions:  PRN Meds:.acetaminophen, acetaminophen, aspirin EC, celecoxib, HYDROcodone-acetaminophen, morphine injection     LOS: 3 days   Shuayb Schepers A. Merlene Laughter, M.D.  Diplomate, Tax adviser of Psychiatry and Neurology ( Neurology).

## 2013-05-23 NOTE — Progress Notes (Signed)
ANTICOAGULATION CONSULT NOTE - follow up  Pharmacy Consult for Lovenox Indication: pulmonary embolus  No Known Allergies  Patient Measurements: Height: 5\' 2"  (157.5 cm) Weight: 264 lb 15.9 oz (120.2 kg) IBW/kg (Calculated) : 50.1  Vital Signs: Temp: 98.5 F (36.9 C) (04/30 0730) Temp src: Axillary (04/30 0730) BP: 131/71 mmHg (04/30 1000) Pulse Rate: 87 (04/30 1000)  Labs:  Recent Labs  05/20/13 1351 05/21/13 0216 05/21/13 0841 05/22/13 0534  HGB 14.7  --  13.3 12.6  HCT 44.2  --  40.1 38.5  PLT 170  --  152 152  CREATININE 0.65  --   --  0.49*  CKTOTAL 120  --   --   --   CKMB 14.0*  --   --   --   TROPONINI 0.36* <0.30 <0.30  --    Estimated Creatinine Clearance: 107.2 ml/min (by C-G formula based on Cr of 0.49).  Medical History: Past Medical History  Diagnosis Date  . Seizures   . Pulmonary hypertension 05/22/2013  . Bilateral pulmonary embolism 05/21/2013   Medications:  Scheduled:  . aspirin EC  325 mg Oral Daily  . atorvastatin  80 mg Oral q1800  . carBAMazepine  100 mg Oral 2 times per day  . carbamazepine  200 mg Oral 4 times per day  . cefTRIAXone (ROCEPHIN)  IV  1 g Intravenous Q24H  . enoxaparin (LOVENOX) injection  120 mg Subcutaneous Q12H  . feeding supplement (PRO-STAT SUGAR FREE 64)  30 mL Oral TID WC  . lamoTRIgine  25 mg Oral BID  . multivitamin with minerals  1 tablet Oral Daily  . potassium chloride SA  20 mEq Oral Daily  . sodium chloride  3 mL Intravenous Q12H   Assessment: 48 yo morbidly obese F admitted with syncope & chest pain.  Chest CT + PE.  She was started on Lovenox.  No bleeding noted.  No DVT has been identified.  Renal function at patient's baseline. Estimated Creatinine Clearance: 107.2 ml/min (by C-G formula based on Cr of 0.49).   Per MD notes, pt may remain on Lovenox injections instead of being switched to PO anticoagulation due to h/o seizures and falls.    Goal of Therapy:  Anti-Xa level 0.6-1 units/ml 4hrs after  LMWH dose given Monitor platelets by anticoagulation protocol: Yes   Plan:  Lovenox 120mg  sq q12h (1mg /Kg per dose) If Lovenox continues, consider checking LMWH level CBC on MWF, f/u platelets Monitor for any s/sx of bleeding complications F/U long-term anticoagulation plans  Markie Heffernan A Alexus Galka 05/23/2013,10:49 AM

## 2013-05-23 NOTE — Clinical Documentation Improvement (Addendum)
Query 1 of 2  Clinical Indicators:   - Height  5'  2" per CHL   - Weight -   246 lbs  7.6 ozs per CHL   - BMI  45.2 per CHL   Possible Clinical Conditions  Morbid Obesity, BMI 45.2  Other Condition  Unable to Clinically Determine    Query 2 of 2  Clinical indicators:  2D Echo this admission shows:  - RV cavity size is moderately reduced,   - RV systolic function is moderately reduced,   - Ventricular Septum shows systolic flattening consistent with RV pressure overload,   - Moderate TR,   - Pulmonary Artery Pressures are severely elevated   - CT evidence of Right Heart Strain per progress notes   Possible Clinical Conditions:   - Acute Cor Pulmonale 2/2 Pulmonary Embolus   - Other Condition   - Unable to Clinically Determine   Thank You, Jerral Ralphathy R Verdella Laidlaw ,RN BSN CCDS Certified Clinical Documentation Specialist:  [phone] [facility]- Health Information Management

## 2013-05-23 NOTE — Progress Notes (Signed)
[name] PROGRESS NOTE  Amber Warner RUE:[id number] DOB: 05/15/1965 DOA: 05/20/2013 PCP: Josue Hector, MD  Summary:  This patient was admitted to the hospital after having a syncopal episode. Patient's mother reports that patient became increasingly short of breath, complained of chest pain and then collapsed. Her mother reports that the patient was becoming cyanotic. Her mother then initiated chest compressions until EMS services arrived approximately 1 minute later. It does not appear that the patient ever truly lost her pulse. Oxygen was applied when EMS arrived and the patient began to improve. She was admitted to the hospital and found to have bilateral pulmonary emboli. She's been started on anticoagulation. Workup for PE has been initiated. She also has a seizure disorder and she has had multiple seizures. Neurology consulted. See below.   Assessment/Plan: 1. Bilateral pulmonary embolus. CT angiogram of the chest indicates bilateral pulmonary emboli. Patient is not on any hormonal treatments. Her family does report that she is very sedentary and mostly lays in bed. When the patient leaves her house, her family keeps her in a wheelchair despite her ability to ambulate. They are concerned that she may fall and injure herself after having a seizure. She does not have any prior history of thromboembolism. She's been started on Lovenox per pharmacy. Hypercoagulable panel has been sent. Venous Dopplers of lower extremities are negative for DVT. She may benefit more from from being discharged on Lovenox rather than Coumadin or Xarelto or Eliquis the cause of her multiple seizures and history of falls. She does have evidence of right heart strain on CT and echo. She continues to have chest pain mostly pleuritic and central. Therefore, we'll order another CT angiogram to assess for pulmonary infarction or other post-embolic abnormalities. We'll continue pain management which was  increased overnight. 2. Severe pulmonary hypertension. This is likely the consequence of bilateral pulmonary emboli. This will be monitored. 3. Acute respiratory failure secondary to #1. Wean oxygen as tolerated. Incentive spirometry. 4. Mildly elevated troponin at 0.3. This was likely a demand ischemia from underlying PE. Troponin I has normalized. 5. Seizure disorder. Family reports the patient has seizures on a daily basis. She takes Tegretol for seizures. EEG noted for epileptiform activity per Dr. Gerilyn Pilgrim. 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 He has added Lamictal. We'll continue supportive treatment and seizure precautions. 6. Syncope. Likely the consequence of bilateral PE. Patient did become unresponsive after becoming short of breath and having chest pain in the field. Her mother reports that she collapsed and became unresponsive. Her mother began chest compressions until EMS arrived. She did not report check for a pulse prior to initiating chest compressions. After EMS arrival, oxygen was applied and patient became more awake and alert. It does not appear that she had a true cardiac arrest. Carotid ultrasound revealed no ICA stenosis. Neurologically, the patient appears to be without any deficits. 7. Possible UTI. Urinalysis indicates nitrite positive urine as well as RBCs in urine. Urine culture pending. Rocephin has been started. Her mild leukocytosis has resolved. Urine culture pending. We'll continue Rocephin.  8. Deconditioning. We'll asked for a physical therapy evaluation.  Code Status: full code Family Communication: discussed with patient and mother at the bedside Disposition Plan: discharge home once improved   Consultants:  Neurology  Procedures: EEG pending 4/28-This recording shows left temporal epileptiform activities  consistent with partial onset epilepsy  syndromes.  2-D echocardiogram:Study Conclusions - Left ventricle: The cavity size was normal. Systolic function was normal. The estimated ejection fraction was in the range of 50% to 55%. Wall motion was normal; there were no regional wall motion abnormalities. There was an increased relative contribution of atrial contraction to ventricular filling. Doppler parameters are consistent with abnormal left ventricular relaxation (grade 1 diastolic dysfunction). Mild to moderate concentric left ventricular hypertrophy. - Ventricular septum: The contour showed systolic flattening. These changes are consistent with RV pressure overload. - Aorta: Mild aortic root dilatation (4 cm). - Right ventricle: The cavity size was moderately dilated. Systolic function was moderately reduced. - Right atrium: The atrium was mildly dilated. - Tricuspid valve: Mildly dilated annulus. Normal leaflet thickness. Moderate regurgitation. - Pulmonary arteries: PA peak pressure: 80mm Hg (S). Severely elevated pulmonary pressures. - Inferior vena cava: The vessel was dilated; the respirophasic diameter changes were blunted (< 50%); findings are consistent with elevated central venous pressure. - Pericardium, extracardiac: A trivial pericardial effusion was identified.    Antibiotics:  Rocephin 4/28  HPI/Subjective: She complains of central pleuritic chest pain which has worsened overnight. Sneezing has resolved. She has a nonproductive cough.  Objective: Filed Vitals:   05/23/13 0800  BP: 108/83  Pulse: 80  Temp:   Resp: 13   temperature 98.5. Oxygen saturation 96% on supplemental oxygen.  Intake/Output Summary (Last 24 hours) at 05/23/13 1018 Last data filed at 05/23/13 0800  Gross per 24 hour  Intake   1130 ml  Output    602 ml  Net    528 ml   Filed Weights   05/21/13 0431 05/22/13 0500 05/23/13 0500  Weight: 111.403 kg (245 lb 9.6 oz) 114.6 kg  (252 lb 10.4 oz) 120.2 kg (264 lb 15.9 oz)    Exam:   General: Obese 48 year old Caucasian woman in no acute distress.  Cardiovascular: S1, S2, with no murmurs rubs or gallops.  Respiratory: Splinting, otherwise lungs are clear to auscultation bilaterally.  Abdomen: Soft obese, positive bowel sounds, nontender, nondistended.  Musculoskeletal: No acute hot joints. No pedal edema.  Neurologic: She is alert and oriented x3. Cranial nerves II through XII are intact. Speech is clear.  Data Reviewed: Basic Metabolic Panel:  Recent Labs Lab 05/20/13 1351 05/22/13 0534  NA 144 142  K 4.1 4.0  CL 106 108  CO2 24 21  GLUCOSE 129* 112*  BUN 21 15  CREATININE 0.65 0.49*  CALCIUM 8.9 8.1*   Liver Function Tests: No results found for this basename: AST, ALT, ALKPHOS, BILITOT, PROT, ALBUMIN,  in the last 168 hours No results found for this basename: LIPASE, AMYLASE,  in the last 168 hours No results found for this basename: AMMONIA,  in the last 168 hours CBC:  Recent Labs Lab 05/20/13 1351 05/21/13 0841 05/22/13 0534  WBC 11.0* 10.8* 9.6  NEUTROABS 8.8*  --   --   HGB 14.7 13.3 12.6  HCT 44.2 40.1 38.5  MCV 96.1 96.9 96.3  PLT 170 152 152   Cardiac Enzymes:  Recent Labs Lab 05/20/13 1351 05/21/13 0216 05/21/13 0841  CKTOTAL 120  --   --   CKMB 14.0*  --   --   TROPONINI 0.36* <0.30 <0.30   BNP (last 3 results) No results found for this basename: PROBNP,  in the last 8760 hours CBG: No results found for this basename: GLUCAP,  in the last 168 hours  Recent Results (from the past 240 hour(s))  MRSA PCR SCREENING  Status: None   Collection Time    05/21/13  3:38 AM      Result Value Ref Range Status   MRSA by PCR NEGATIVE  NEGATIVE Final   Comment:            The GeneXpert MRSA Assay (FDA     approved for NASAL specimens     only), is one component of a     comprehensive MRSA colonization     surveillance program. It is not     intended to diagnose  MRSA     infection nor to guide or     monitor treatment for     MRSA infections.     Studies: [place] Carotid Bilateral  05/21/2013   CLINICAL DATA:  Syncope  EXAM: BILATERAL CAROTID DUPLEX ULTRASOUND  TECHNIQUE: Wallace CullensGray scale imaging, color Doppler and duplex ultrasound were performed of bilateral carotid and vertebral arteries in the neck.  COMPARISON:  None.  FINDINGS: Criteria: Quantification of carotid stenosis is based on velocity parameters that correlate the residual internal carotid diameter with NASCET-based stenosis levels, using the diameter of the distal internal carotid lumen as the denominator for stenosis measurement.  The following velocity measurements were obtained:  RIGHT  ICA:  72/29 cm/sec  CCA:  51/11 cm/sec  SYSTOLIC ICA/CCA RATIO:  1.41  DIASTOLIC ICA/CCA RATIO:  2.50  ECA:  97 cm/sec  LEFT  ICA:  68/22 cm/sec  CCA:  72/12 cm/sec  SYSTOLIC ICA/CCA RATIO:  0.94  DIASTOLIC ICA/CCA RATIO:  1.84  ECA:  65 cm/sec  RIGHT CAROTID ARTERY: Minor echogenic shadowing plaque formation. No hemodynamically significant right ICA stenosis, velocity elevation, or turbulent flow. Degree of narrowing less than 50%.  RIGHT VERTEBRAL ARTERY:  Antegrade  LEFT CAROTID ARTERY: Similar scattered minor echogenic plaque formation. No hemodynamically significant left ICA stenosis, velocity elevation, or turbulent flow.  LEFT VERTEBRAL ARTERY:  Antegrade  IMPRESSION: Minor carotid atherosclerosis. No hemodynamically significant ICA stenosis by ultrasound.   Electronically Signed   By: Ruel Favorsrevor  Shick M.D.   On: 05/21/2013 11:11   [place] Venous Img Lower Bilateral  05/21/2013   CLINICAL DATA:  Syncope  EXAM: Bilateral LOWER EXTREMITY VENOUS DUPLEX ULTRASOUND  TECHNIQUE: Doppler venous assessment of the bilateral lower extremity deep venous system was performed, including characterization of spectral flow, compressibility, and phasicity.  COMPARISON:  None.  FINDINGS: There is complete compressibility of the bilateral  common femoral, femoral, and popliteal veins. Doppler analysis demonstrates respiratory phasicity and augmentation of flow upon calf compression. No obvious superficial vein or calf vein thrombosis.  IMPRESSION: No evidence of lower extremity DVT.   Electronically Signed   By: Maryclare BeanArt  Hoss M.D.   On: 05/21/2013 16:22    Scheduled Meds: . aspirin EC  325 mg Oral Daily  . atorvastatin  80 mg Oral q1800  . carBAMazepine  100 mg Oral 2 times per day  . carbamazepine  200 mg Oral 4 times per day  . cefTRIAXone (ROCEPHIN)  IV  1 g Intravenous Q24H  . enoxaparin (LOVENOX) injection  110 mg Subcutaneous Q12H  . feeding supplement (PRO-STAT SUGAR FREE 64)  30 mL Oral TID WC  . lamoTRIgine  25 mg Oral BID  . multivitamin with minerals  1 tablet Oral Daily  . potassium chloride SA  20 mEq Oral Daily  . sodium chloride  3 mL Intravenous Q12H   Continuous Infusions: . sodium chloride      Principal Problem:   Bilateral pulmonary embolism Active Problems:  Syncope   Seizure disorder   Chest pain   Hypokalemia   Acute respiratory failure   Pulmonary emboli   Pulmonary hypertension   Morbid obesity   UTI (urinary tract infection)    Time spent: 40mins    Elliot Cousinenise [employer]  [name] Pager [phone]. If 7PM-7AM, please contact night-coverage at [email or web address], password [email or web address] 05/23/2013, 10:18 AM  LOS: 3 days

## 2013-05-24 ENCOUNTER — Encounter (HOSPITAL_COMMUNITY): Payer: Self-pay | Admitting: Internal Medicine

## 2013-05-24 DIAGNOSIS — I519 Heart disease, unspecified: Secondary | ICD-10-CM

## 2013-05-24 DIAGNOSIS — I5189 Other ill-defined heart diseases: Secondary | ICD-10-CM

## 2013-05-24 HISTORY — DX: Other ill-defined heart diseases: I51.89

## 2013-05-24 LAB — CBC
HEMATOCRIT: 37 % (ref 36.0–46.0)
Hemoglobin: 11.9 g/dL — ABNORMAL LOW (ref 12.0–15.0)
MCH: 31.2 pg (ref 26.0–34.0)
MCHC: 32.2 g/dL (ref 30.0–36.0)
MCV: 96.9 fL (ref 78.0–100.0)
PLATELETS: 154 10*3/uL (ref 150–400)
RBC: 3.82 MIL/uL — ABNORMAL LOW (ref 3.87–5.11)
RDW: 12.9 % (ref 11.5–15.5)
WBC: 8.4 10*3/uL (ref 4.0–10.5)

## 2013-05-24 LAB — HEPARIN ANTI-XA: Heparin LMW: 1.26 IU/mL

## 2013-05-24 LAB — CK: Total CK: 103 U/L (ref 7–177)

## 2013-05-24 MED ORDER — ENOXAPARIN SODIUM 150 MG/ML ~~LOC~~ SOLN
150.0000 mg | SUBCUTANEOUS | Status: DC
Start: 2013-05-25 — End: 2013-05-25
  Administered 2013-05-25: 150 mg via SUBCUTANEOUS
  Filled 2013-05-24 (×3): qty 1

## 2013-05-24 NOTE — Progress Notes (Signed)
PT SITTING UP IN BED AND HAD SEIZURE THAT LASTED APROXIMENTLY 30 SEC W/ IMMEDIATE RECOVERY TO BEING ALERT AND ORIENTED.

## 2013-05-24 NOTE — Progress Notes (Signed)
[name] PROGRESS NOTE  Amber Warner ZOX:[id number] DOB: [birth year] DOA: 05/20/2013 PCP: Josue Hector, MD  Summary:  This patient was admitted to the hospital after having a syncopal episode. Patient's mother reports that patient became increasingly short of breath, complained of chest pain and then collapsed. Her mother reports that the patient was becoming cyanotic. Her mother then initiated chest compressions until EMS services arrived approximately 1 minute later. It does not appear that the patient ever truly lost her pulse. Oxygen was applied when EMS arrived and the patient began to improve. She was admitted to the hospital and found to have bilateral pulmonary emboli. She's been started on anticoagulation. Workup for PE has been initiated. She also has a seizure disorder and she has had multiple seizures. Neurology consulted. See below.   Assessment/Plan: 1. Bilateral pulmonary embolus. CT angiogram of the chest indicates bilateral pulmonary emboli. Patient is not on any hormonal treatments. Her family does report that she is very sedentary and mostly lays in bed. When the patient leaves her house, her family keeps her in a wheelchair despite her ability to ambulate. They are concerned that she may fall and injure herself after having a seizure. She does not have any prior history of thromboembolism. She's been started on Lovenox per pharmacy. Hypercoagulable panel is grossly normal. Venous Dopplers of lower extremities are negative for DVT. She may benefit more from from being discharged on Lovenox rather than Coumadin or Xarelto or Eliquis because of her multiple seizures and history of falls. She does have evidence of right heart strain on CT and echo. She continues to have chest pain mostly pleuritic and central. Followup CT scan of the chest with contrast reveals stable pulmonary thromboembolism and bilateral ground glass opacities mostly related to volume loss. We'll  continue to treat her pain appropriately and we'll add incentive spirometry. 2. Severe pulmonary hypertension and right heart dysfunction, per 2-D echocardiogram. No evidence of decompensated heart failure. This is likely the consequence of bilateral pulmonary emboli. This will be monitored. 3. Acute respiratory failure secondary to #1. She is oxygenating 100% on 2-3 L of supplemental oxygen. Clearly, her hypoxia is resolving. We'll wean oxygen appropriately. 4. Mildly elevated troponin at 0.3. This was likely a demand ischemia from underlying PE. Troponin I has normalized. 5. Seizure disorder. Family reports the patient has seizures on a daily basis. She takes Tegretol for seizures. EEG noted for epileptiform activity per Dr. Gerilyn Pilgrim. 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  Lamictal was added per Dr. Gerilyn Pilgrim. We'll continue supportive treatment and seizure precautions. 6. Syncope. Likely the consequence of bilateral PE. Patient did become unresponsive after becoming short of breath and having chest pain in the field. Her mother reports that she collapsed and became unresponsive. Her mother began chest compressions until EMS arrived. She did not report check for a pulse prior to initiating chest compressions. After EMS arrival, oxygen was applied and patient became more awake and alert. It does not appear that she had a true cardiac arrest. Carotid ultrasound revealed no ICA stenosis. Neurologically, the patient appears to be without any deficits. 7. Possible UTI. Urinalysis indicates nitrite positive urine as well as RBCs in urine. Urine culture grew out greater than 100,000 colonies of multiple bacteria. We'll continue Rocephin empirically for 5-7 days. Rocephin has been started. Her mild leukocytosis has resolved.  8. Deconditioning. We'll asked  for a  physical therapy evaluation.  Code Status: full code Family Communication: discussed with patient and mother at the bedside Disposition Plan: discharge home once improved   Consultants:  Neurology  Procedures: EEG pending 4/28-This recording shows left temporal epileptiform activities consistent with partial onset epilepsy syndromes.  2-D echocardiogram:Study Conclusions - Left ventricle: The cavity size was normal. Systolic function was normal. The estimated ejection fraction was in the range of 50% to 55%. Wall motion was normal; there were no regional wall motion abnormalities. There was an increased relative contribution of atrial contraction to ventricular filling. Doppler parameters are consistent with abnormal left ventricular relaxation (grade 1 diastolic dysfunction). Mild to moderate concentric left ventricular hypertrophy. - Ventricular septum: The contour showed systolic flattening. These changes are consistent with RV pressure overload. - Aorta: Mild aortic root dilatation (4 cm). - Right ventricle: The cavity size was moderately dilated. Systolic function was moderately reduced. - Right atrium: The atrium was mildly dilated. - Tricuspid valve: Mildly dilated annulus. Normal leaflet thickness. Moderate regurgitation. - Pulmonary arteries: PA peak pressure: 80mm Hg (S). Severely elevated pulmonary pressures. - Inferior vena cava: The vessel was dilated; the respirophasic diameter changes were blunted (< 50%); findings are consistent with elevated central venous pressure. - Pericardium, extracardiac: A trivial pericardial effusion was identified.    Antibiotics:  Rocephin 4/28>>  HPI/Subjective: She has less pleuritic chest pain and less shortness of breath at rest. Jerilynn Somessalon Perles is helping with cough.  Objective: Filed Vitals:   05/24/13 0800  BP: 121/69  Pulse: 70  Temp: 97.9 F (36.6 C)  Resp: 14   temperature 98.5. Oxygen saturation 96% on  supplemental oxygen.  Intake/Output Summary (Last 24 hours) at 05/24/13 [id number] Last data filed at 05/24/13 0800  Gross per 24 hour  Intake   2660 ml  Output    550 ml  Net   2110 ml   Filed Weights   05/22/13 0500 05/23/13 0500 05/24/13 0500  Weight: 114.6 kg (252 lb 10.4 oz) 120.2 kg (264 lb 15.9 oz) 120.9 kg (266 lb 8.6 oz)    Exam:   General: Obese 48 year old Caucasian woman in no acute distress.  Cardiovascular: S1, S2, with no murmurs rubs or gallops.  Respiratory: Splinting, otherwise lungs are clear to auscultation bilaterally.  Abdomen: Soft obese, positive bowel sounds, nontender, nondistended.  Musculoskeletal: No acute hot joints. No pedal edema.  Neurologic: She is alert and oriented x3. Cranial nerves II through XII are intact. Speech is clear.  Data Reviewed: Basic Metabolic Panel:  Recent Labs Lab 05/20/13 1351 05/22/13 0534  NA 144 142  K 4.1 4.0  CL 106 108  CO2 24 21  GLUCOSE 129* 112*  BUN 21 15  CREATININE 0.65 0.49*  CALCIUM 8.9 8.1*   Liver Function Tests: No results found for this basename: AST, ALT, ALKPHOS, BILITOT, PROT, ALBUMIN,  in the last 168 hours No results found for this basename: LIPASE, AMYLASE,  in the last 168 hours No results found for this basename: AMMONIA,  in the last 168 hours CBC:  Recent Labs Lab 05/20/13 1351 05/21/13 0841 05/22/13 0534 05/24/13 0456  WBC 11.0* 10.8* 9.6 8.4  NEUTROABS 8.8*  --   --   --   HGB 14.7 13.3 12.6 11.9*  HCT 44.2 40.1 38.5 37.0  MCV 96.1 96.9 96.3 96.9  PLT 170 152 152 154   Cardiac Enzymes:  Recent Labs Lab 05/20/13 1351 05/21/13 0216 05/21/13 0841  CKTOTAL 120  --   --  CKMB 14.0*  --   --   TROPONINI 0.36* <0.30 <0.30   BNP (last 3 results) No results found for this basename: PROBNP,  in the last 8760 hours CBG: No results found for this basename: GLUCAP,  in the last 168 hours  Recent Results (from the past 240 hour(s))  MRSA PCR SCREENING     Status: None    Collection Time    05/21/13  3:38 AM      Result Value Ref Range Status   MRSA by PCR NEGATIVE  NEGATIVE Final   Comment:            The GeneXpert MRSA Assay (FDA     approved for NASAL specimens     only), is one component of a     comprehensive MRSA colonization     surveillance program. It is not     intended to diagnose MRSA     infection nor to guide or     monitor treatment for     MRSA infections.  URINE CULTURE     Status: None   Collection Time    05/21/13  7:00 PM      Result Value Ref Range Status   Specimen Description URINE, CLEAN CATCH   Final   Special Requests NONE   Final   Culture  Setup Time     Final   Value: 05/21/2013 20:05     [facility]   Colony Count     Final   Value: >=100,000 COLONIES/ML     [facility]   Culture     Final   Value: Multiple bacterial morphotypes present, none predominant. Suggest appropriate recollection if clinically indicated.     [facility] at [facility]   Report Status 05/23/2013 FINAL   Final     Studies: Ct Chest W Contrast  05/23/2013   CLINICAL DATA:  Pulmonary embolism  EXAM: CT CHEST WITH CONTRAST  TECHNIQUE: Multidetector CT imaging of the chest was performed during intravenous contrast administration.  CONTRAST:  80mL OMNIPAQUE IOHEXOL 300 MG/ML  SOLN  FINDINGS: Stable bilateral pulmonary thromboembolism.  No abnormal mediastinal adenopathy or mediastinal hemorrhage.  No pneumothorax.  No pleural effusion.  Lungs are under aerated. Patchy ground-glass bilaterally may simply be related to volume loss. An inflammatory process would be less likely. No findings to strongly suggest development of a pulmonary infarct.  IMPRESSION: Stable pulmonary thromboembolism.  Bilateral ground-glass opacities are most likely related to volume loss.   Electronically Signed   By: Maryclare BeanArt  Hoss M.D.   On: 05/23/2013 12:52    Scheduled Meds: . aspirin EC  325 mg Oral Daily  . atorvastatin  80  mg Oral q1800  . carBAMazepine  100 mg Oral 2 times per day  . carbamazepine  200 mg Oral 4 times per day  . cefTRIAXone (ROCEPHIN)  IV  1 g Intravenous Q24H  . enoxaparin (LOVENOX) injection  120 mg Subcutaneous Q12H  . feeding supplement (PRO-STAT SUGAR FREE 64)  30 mL Oral TID WC  . lamoTRIgine  25 mg Oral BID  . multivitamin with minerals  1 tablet Oral Daily  . potassium chloride SA  20 mEq Oral Daily  . sodium chloride  3 mL Intravenous Q12H   Continuous Infusions: . 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 If 7PM-7AM, please contact night-coverage at [email or web address], password [facility] 05/24/2013, 9:21 AM  LOS: 4 days

## 2013-05-24 NOTE — Progress Notes (Signed)
ANTICOAGULATION CONSULT NOTE - follow up  Pharmacy Consult for Lovenox Indication: pulmonary embolus  No Known Allergies  Patient Measurements: Height: 5\' 2"  (157.5 cm) Weight: 266 lb 8.6 oz (120.9 kg) IBW/kg (Calculated) : 50.1  Vital Signs: Temp: 98.1 F (36.7 C) (05/01 1200) Temp src: Oral (05/01 1200) BP: 125/65 mmHg (05/01 1400) Pulse Rate: 95 (05/01 1230)  Labs:  Recent Labs  05/22/13 0534 05/24/13 0456 05/24/13 0854  HGB 12.6 11.9*  --   HCT 38.5 37.0  --   PLT 152 154  --   CREATININE 0.49*  --   --   CKTOTAL  --   --  103   Estimated Creatinine Clearance: 107.6 ml/min (by C-G formula based on Cr of 0.49).  Medical History: Past Medical History  Diagnosis Date  . Seizures   . Pulmonary hypertension 05/22/2013  . Bilateral pulmonary embolism 05/21/2013  . 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 aspirin EC  325 mg Oral Daily  . atorvastatin  80 mg Oral q1800  . carBAMazepine  100 mg Oral 2 times per day  . carbamazepine  200 mg Oral 4 times per day  . cefTRIAXone (ROCEPHIN)  IV  1 g Intravenous Q24H  . enoxaparin (LOVENOX) injection  120 mg Subcutaneous Q12H  . feeding supplement (PRO-STAT SUGAR FREE 64)  30 mL Oral TID WC  . lamoTRIgine  25 mg Oral BID  . multivitamin with minerals  1 tablet Oral Daily  . potassium chloride SA  20 mEq Oral Daily  . sodium chloride  3 mL Intravenous Q12H   Assessment: 48 yo morbidly obese F admitted with syncope & chest pain.  Chest CT + PE.  She was started on Lovenox.  No bleeding noted.  No DVT has been identified.  Hypercoagulable panel is normal.  Renal function at patient's baseline.  Per MD notes, pt may remain on Lovenox injections instead of being switched to PO anticoagulation due to h/o seizures and falls.   4hr anti-Xa level is elevated above goal range. No bleeding noted.   Goal of Therapy:  Anti-Xa level 0.6-1 units/ml 4hrs after LMWH dose  given Monitor platelets by anticoagulation protocol: Yes   Plan:  Change Lovenox 150mg  sq q24h (1.5mg /kg/dose) -attempt to decrease total daily injections for outpatient use Recheck LMWH level at steady-state  CBC on MWF Monitor for any s/sx of bleeding complications F/U long-term anticoagulation plans  Mercy Ridingndrea Janell Caralyn Twining 05/24/2013,2:28 PM

## 2013-05-24 NOTE — Care Management Note (Unsigned)
    Page 1 of 1   05/24/2013     5:12:05 PM CARE MANAGEMENT NOTE 05/24/2013  Patient:  Amber Warner,Amber Warner   Account Number:  [id number]  Date Initiated:  05/24/2013  Documentation initiated by:  Anibal HendersonBOLDEN,Rainelle Sulewski  Subjective/Objective Assessment:   Admitted with bil PE's, and is having small seizures today, possible pseudo-seizures. Has Warner Hx of seizures. Pt is from home and lives with mother. Both she and mother agree she will be returning home at D/C. Mother states they do very well     Action/Plan:   at home, but that pt stays in bed to much and they are making plans to get her out more. No needs anticipated, but will follow   Anticipated DC Date:  05/26/2013   Anticipated DC Plan:  HOME/SELF CARE      DC Planning Services  CM consult      Choice offered to / List presented to:             Status of service:  In process, will continue to follow Medicare Important Message given?   (If response is "NO", the following Medicare IM given date fields will be blank) Date Medicare IM given:   Date Additional Medicare IM given:    Discharge Disposition:    Per UR Regulation:    If discussed at Long Length of Stay Meetings, dates discussed:    Comments:  05/24/13 1500 Anibal HendersonGeneva Loyal Rudy RN/CM

## 2013-05-24 NOTE — Progress Notes (Signed)
Patient ID: Amber Warner, female   DOB: 30-Aug-1965, 48 y.o.   MRN: [id number]  [facility] Amber Warner A. Amber Pilgrimoonquah, MD     [email or web address]          Amber Warner is an 48 y.o. female.   Assessment/Plan: 1. 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 The heterotopia likely represents the culprit causing the epilepsy syndrome. However, there is a strong family history of epilepsy raising the suspicion of genetically determined epilepsy syndromes. Given that the patient's seizures are now well controlled, additional medications will be given. I think we will start the patient on Lamictal and titrate to efficacy. We'll continue with both medications. The lamictal titrated gradually to prevent side effects. For now, we'll continue with the Tegretol. We'll may increase the lamictal next week but we have to continue with the gradual titration. 2. Pulmonary embolism.  3. Syncope likely due to pulmonary embolism.  4. Chest pain thought likely to be due to the patient's pulmonary embolism.  The nursing staff reports that she is having frequent spells. She is having about 2 every shift. They are not necessarily stereotyped. She leans forward and may look to the left on the right. There are short-lived lasting about 30-60 seconds.  GENERAL: Pleasant obese lady who is in no acute distress.  HEENT: Supple. Atraumatic normocephalic. Bilateral esotropia.  ABDOMEN: soft  EXTREMITIES: No edema  BACK: Normal.  SKIN: Normal by inspection.  MENTAL STATUS: Alert and oriented. Speech, language and cognition are generally intact. Judgment and insight good.  CRANIAL NERVES: Pupils are equal, round and reactive to light and accommodation; extra ocular movements are full, there is no significant nystagmus; visual fields are full; upper and lower facial muscles are normal in  strength and symmetric, there is no flattening of the nasolabial folds; tongue is midline; uvula is midline; shoulder elevation is normal.  MOTOR: Normal tone, bulk and strength; no pronator drift.  COORDINATION: Left finger to nose is normal, right finger to nose is normal, No rest tremor; no intention tremor; no postural tremor; no bradykinesia.   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    Objective: Vital signs in last 24 hours: Temp:  [97.7 F (36.5 C)-98.9 F (37.2 C)] 98.1 F (36.7 C) (05/01 1200) Pulse Rate:  [47-95] 85 (05/01 1600) Resp:  [10-26] 20 (05/01 1600) BP: (94-165)/(58-104) 114/62 mmHg (05/01 1600) SpO2:  [96 %-100 %] 99 % (05/01 1600) Weight:  [120.9 kg (266 lb 8.6 oz)] 120.9 kg (266 lb 8.6 oz) (05/01 0500)  Intake/Output from previous day: 04/30 0701 - 05/01 0700 In: 2585 [P.O.:960; I.V.:1575; IV Piggyback:50] Out: 850 [Urine:850] Intake/Output this shift: Total I/O In: 1685 [P.O.:960; I.V.:675; IV Piggyback:50] Out: 450 [Urine:450] Nutritional status: Cardiac   Lab Results: Results for orders placed during the hospital encounter of 05/20/13 (from the past 48 hour(s))  CBC     Status: Abnormal   Collection Time    05/24/13  4:56 AM      Result Value Ref Range   WBC 8.4  4.0 - 10.5 K/uL   RBC 3.82 (*) 3.87 - 5.11 MIL/uL   Hemoglobin 11.9 (*) 12.0 - 15.0 g/dL   HCT 08.637.0  57.836.0 - 46.946.0 %   MCV 96.9  78.0 - 100.0 fL   MCH 31.2  26.0 - 34.0 pg   MCHC 32.2  30.0 -  36.0 g/dL   RDW 40.912.9  81.111.5 - 91.415.5 %   Platelets 154  150 - 400 K/uL  CK     Status: None   Collection Time    05/24/13  8:54 AM      Result Value Ref Range   Total CK 103  7 - 177 U/L  LOW MOLECULAR WGT HEPARIN (FRACTIONATED)     Status: None   Collection Time    05/24/13  1:32 PM      Result Value Ref Range   Heparin LMW 1.26     Comment:            THERAPEUTIC RANGE:     DVT,PE,ACS on LMWH 1 mg/kg q12     at 4  hrs = 0.5-1.2 units/mL.     DVT,PE on LMWH 1.5 mg/kg q24     at 4 hrs = 1-2 units/mL.    Lipid Panel No results found for this basename: CHOL, TRIG, HDL, CHOLHDL, VLDL, LDLCALC,  in the last 72 hours  Studies/Results: Ct Chest W Contrast  05/23/2013   CLINICAL DATA:  Pulmonary embolism  EXAM: CT CHEST WITH CONTRAST  TECHNIQUE: Multidetector CT imaging of the chest was performed during intravenous contrast administration.  CONTRAST:  80mL OMNIPAQUE IOHEXOL 300 MG/ML  SOLN  FINDINGS: Stable bilateral pulmonary thromboembolism.  No abnormal mediastinal adenopathy or mediastinal hemorrhage.  No pneumothorax.  No pleural effusion.  Lungs are under aerated. Patchy ground-glass bilaterally may simply be related to volume loss. An inflammatory process would be less likely. No findings to strongly suggest development of a pulmonary infarct.  IMPRESSION: Stable pulmonary thromboembolism.  Bilateral ground-glass opacities are most likely related to volume loss.   Electronically Signed   By: Amber BeanArt  Hoss M.D.   On: 05/23/2013 12:52    Medications:  Scheduled Meds: . aspirin EC  325 mg Oral Daily  . atorvastatin  80 mg Oral q1800  . carBAMazepine  100 mg Oral 2 times per day  . carbamazepine  200 mg Oral 4 times per day  . cefTRIAXone (ROCEPHIN)  IV  1 g Intravenous Q24H  . [START ON 05/25/2013] enoxaparin (LOVENOX) injection  150 mg Subcutaneous Q24H  . feeding supplement (PRO-STAT SUGAR FREE 64)  30 mL Oral TID WC  . lamoTRIgine  25 mg Oral BID  . multivitamin with minerals  1 tablet Oral Daily  . potassium chloride SA  20 mEq Oral Daily  . sodium chloride  3 mL Intravenous Q12H   Continuous Infusions: . sodium chloride 75 mL/hr at 05/24/13 1600   PRN Meds:.acetaminophen, acetaminophen, benzonatate, celecoxib, HYDROcodone-acetaminophen, morphine injection     LOS: 4 days   Amber Warner A. Amber Warner, M.D.  Diplomate, Biomedical engineerAmerican Board of Psychiatry and Neurology ( Neurology).

## 2013-05-25 ENCOUNTER — Inpatient Hospital Stay (HOSPITAL_COMMUNITY): Payer: Medicare Other

## 2013-05-25 ENCOUNTER — Encounter (HOSPITAL_COMMUNITY): Payer: Self-pay | Admitting: Internal Medicine

## 2013-05-25 DIAGNOSIS — I2789 Other specified pulmonary heart diseases: Secondary | ICD-10-CM

## 2013-05-25 DIAGNOSIS — E785 Hyperlipidemia, unspecified: Secondary | ICD-10-CM

## 2013-05-25 DIAGNOSIS — Q048 Other specified congenital malformations of brain: Secondary | ICD-10-CM

## 2013-05-25 HISTORY — DX: Hyperlipidemia, unspecified: E78.5

## 2013-05-25 HISTORY — DX: Other specified congenital malformations of brain: Q04.8

## 2013-05-25 LAB — FACTOR 5 LEIDEN

## 2013-05-25 LAB — PROTHROMBIN GENE MUTATION

## 2013-05-25 MED ORDER — LAMOTRIGINE 25 MG PO TABS: 25.0000 mg | ORAL_TABLET | Freq: Two times a day (BID) | ORAL | Status: DC

## 2013-05-25 MED ORDER — ATORVASTATIN CALCIUM 10 MG PO TABS: 10.0000 mg | ORAL_TABLET | Freq: Every day | ORAL | Status: AC

## 2013-05-25 MED ORDER — HYDROCODONE-ACETAMINOPHEN 5-325 MG PO TABS: 1.0000 | ORAL_TABLET | Freq: Four times a day (QID) | ORAL | Status: DC | PRN

## 2013-05-25 MED ORDER — ENOXAPARIN SODIUM 150 MG/ML ~~LOC~~ SOLN: 150.0000 mg | SUBCUTANEOUS | Status: DC

## 2013-05-25 MED ORDER — BENZONATATE 100 MG PO CAPS: 100.0000 mg | ORAL_CAPSULE | Freq: Three times a day (TID) | ORAL | Status: AC | PRN

## 2013-05-25 NOTE — Progress Notes (Signed)
Patient taken off of oxygen per MD for room air saturation check.  Oxygen saturation checked after thirty minutes of being on room air and found to be 97%.  Will continue to monitor.  Candelaria StagersLeigh Anne Schonewitz 05/25/2013

## 2013-05-25 NOTE — Discharge Summary (Signed)
Physician Discharge Summary  Amber Warner ZOX:[id number] DOB: [birth year] DOA: 05/20/2013  PCP: Josue Hector, MD  Admit date: 05/20/2013 Discharge date: 05/25/2013  Time spent: Greater than 30 minutes  Recommendations for Outpatient Follow-up:  1. Patient is being discharged on Lovenox for anticoagulation, rather than oral anticoagulation due to her fall risk and seizures. 2. The patient's mother was not pleased that the patient was being discharged although she was treated appropriately in the hospital for 5 days. It was explained to her that the patient was hemodynamically stable and her hypoxia had resolved. Nevertheless, she stated that she would take the patient to [facility] for a second opinion.    Discharge Diagnoses:  1. Bilateral pulmonary emboli. Hypercoagulable panel was unremarkable. 2. Pulmonary hypertension and right ventricular systolic dysfunction without heart failure, secondary to bilateral pulmonary emboli. 3. Syncope secondary to bilateral pulmonary emboli and possible seizure. 4. Long-standing seizure disorder. Per Dr. Gerilyn Pilgrim, neurologist, the patient has epilepsy syndrome with multiple seizure types. 5. 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 6. Mildly elevated troponin I, likely from demand ischemia from underlying PE. Troponin I normalize. 7. Acute hypoxic respiratory failure secondary to bilateral PE. Resolved. The patient's oxygen saturation was 97% on room air at the time of discharge. 8. Mild urinary tract infection. Treated with Rocephin during hospitalization. 9. 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 10. Hypokalemia, supplement and repleted. 11. Morbid obesity. 12. Hyperlipidemia. Fasting lipid profile revealed total cholesterol 200, triglycerides 144, HDL cholesterol 36, and LDL cholesterol 135.  Discharge Condition: Improved.  Diet  recommendation: Heart healthy.  Filed Weights   05/23/13 0500 05/24/13 0500 05/25/13 0500  Weight: 120.2 kg (264 lb 15.9 oz) 120.9 kg (266 lb 8.6 oz) 120.7 kg (266 lb 1.5 oz)    History of present illness:  The patient is a 48 year old woman with a history of seizure disorder/epilepsy, who presented to the emergency department on 05/20/2013 with a syncopal episode and left-sided chest pain. Her mother stated that the patient was short of breath and turned "blue". She initiated CPR briefly. When the paramedics arrived, they gave her supplemental oxygen and apparently, she improved. In the ED, she was oxygenating 96% on 3 L of oxygen. She was afebrile. She was hemodynamically stable. Her lab data were significant for a WBC of 11.0, glucose of 129, carbamazepine level is 10. 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 Her chest x-ray revealed no acute cardiopulmonary findings. Her EKG revealed incomplete bundle branch block, sinus rhythm, and heart rate of 100 beats per minute. Her urinalysis was suggestive of a possible UTI. She was admitted for further evaluation and management.  Hospital Course:   This patient was admitted to the hospital after having a syncopal episode. Patient's mother reported that patient became increasingly short of breath, complained of chest pain and then collapsed. Her mother reported that the patient was becoming cyanotic. Her mother then initiated chest compressions until EMS services arrived approximately 1 minute later. It did not appear that the patient ever truly lost her pulse. Oxygen was applied when EMS arrived and the patient began to improve. She was admitted to the hospital and found to have bilateral pulmonary emboli. She's been started on anticoagulation. Workup for PE was  initiated. She also has a seizure disorder and she has had multiple  seizures. Neurology was consulted. See below.  1. Bilateral pulmonary embolus. CT angiogram of the chest indicated bilateral pulmonary emboli. Patient iwas not on any hormonal treatments. Her family does report that she is very sedentary and mostly lays in bed. When the patient leaves her house, her family keeps her in a wheelchair despite her ability to ambulate. They were concerned that she may fall and injure herself after having a seizure. She does not have any prior history of thromboembolism. She's was started on Lovenox per pharmacy. Hypercoagulable panel was grossly normal. Venous Dopplers of lower extremities were negative for DVT. She may benefit more from from being discharged on Lovenox rather than Coumadin or Xarelto or Eliquis because of her multiple seizures and history of falls. She did have evidence of right heart strain on CT and echo. She continued to have chest pain mostly pleuritic and central, but overall, her pain subsided substantially. She was treated with analgesics as needed. Followup CT scan of the chest with contrast revealed stable pulmonary thromboembolism and bilateral ground glass opacities mostly related to volume loss. She was encouraged to use the incentive spirometry. A followup chest x-ray was offered when the patient had chest pain prior to discharge, but her mother refused. 2. Severe pulmonary hypertension and right heart dysfunction, per 2-D echocardiogram. No evidence of decompensated heart failure. This was likely the consequence of bilateral pulmonary emboli and should improve over time with treatment. 3. Acute respiratory failure secondary to #1. She was initially severely hypoxic on room air and with supplemental oxygen. Supplemental oxygen was titrated appropriately. Her oxygen was weaned off and at the time of hospital discharge, she was oxygenating 97% on room air.  4. Mildly elevated troponin at 0.3. This was likely the consequence of demand ischemia from  underlying PE. Troponin I did normalize. 5. Seizure disorder. Family reported the patient has seizures on a daily basis at home. She takes Tegretol for seizures. Neurologist, Dr. Gerilyn Pilgrimoonquah was consulted. EEG noted for epileptiform activity per Dr. Gerilyn Pilgrimoonquah. 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 Lamictal was added per Dr. Gerilyn Pilgrimoonquah. 6. Syncope. Likely the consequence of bilateral PE. Patient did become unresponsive after becoming short of breath and having chest pain in the field. Her mother reported that she collapsed and became unresponsive. Her mother began chest compressions until EMS arrived. She did not report check for a pulse prior to initiating chest compressions. After EMS arrival, oxygen was applied and patient became more awake and alert. It does not appear that she had a true cardiac arrest. Carotid ultrasound revealed no ICA stenosis. Neurologically, the patient appears to be without any deficits. 7. Possible UTI. Urinalysis indicates nitrite positive urine as well as RBCs in urine. Urine culture grew out greater than 100,000 colonies of multiple bacteria. She was treated with Rocephin for 5 days. Antibodies were discontinued upon discharge. Her mild leukocytosis has resolved.  8. Hyperlipidemia. Her fasting lipid profile results were dictated above. She was started on 80 mg of Lipitor, but this has since been decreased to 10 mg.    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   2-D echocardiogram:Study Conclusions - Left ventricle: The cavity size was normal. Systolic function was normal. The estimated ejection fraction was in the range of 50% to 55%. Wall motion was normal; there were no regional wall motion abnormalities. There was  an  increased relative contribution of atrial contraction to ventricular filling. Doppler parameters are consistent with abnormal left ventricular relaxation (grade 1 diastolic dysfunction). Mild to moderate concentric left ventricular hypertrophy. - Ventricular septum: The contour showed systolic flattening. These changes are consistent with RV pressure overload. - Aorta: Mild aortic root dilatation (4 cm). - Right ventricle: The cavity size was moderately dilated. Systolic function was moderately reduced. - Right atrium: The atrium was mildly dilated. - Tricuspid valve: Mildly dilated annulus. Normal leaflet thickness. Moderate regurgitation. - Pulmonary arteries: PA peak pressure: 80mm Hg (S). Severely elevated pulmonary pressures. - Inferior vena cava: The vessel was dilated; the respirophasic diameter changes were blunted (< 50%); findings are consistent with elevated central venous pressure. - Pericardium, extracardiac: A trivial pericardial effusion was identified.   Consultations:  Neurologist, Dr. Gerilyn Pilgrim  Discharge Exam: Filed Vitals:   05/25/13 0800  BP: 112/62  Pulse: 67  Temp: 98.3 F (36.8 C)  Resp: 13    General: Obese alert 48 year old Caucasian woman sitting up in bed, in no acute distress. Cardiovascular: S1, S2, with no murmurs rubs or gallops. Respiratory: Breathing nonlabored. Significantly less splinting. Coarse breath sounds in the bases Neurologic: She is alert and oriented x3. Cranial nerves II through XII are intact.  Discharge Instructions You were cared for by a hospitalist during your hospital stay. If you have any questions about your discharge medications or the care you received while you were in the hospital after you are discharged, you can call the unit and asked to speak with the hospitalist on call if the hospitalist that took care of you is not available. Once you are discharged, your primary care physician will handle any further  medical issues. Please note that NO REFILLS for any discharge medications will be authorized once you are discharged, as it is imperative that you return to your primary care physician (or establish a relationship with a primary care physician if you do not have one) for your aftercare needs so that they can reassess your need for medications and monitor your lab values.  Discharge Orders   Future Orders Complete By Expires   Diet - low sodium heart healthy  As directed    Discharge instructions  As directed    Increase activity slowly  As directed        Medication List    STOP taking these medications       aspirin 500 MG EC tablet     celecoxib 200 MG capsule  Commonly known as:  CELEBREX      TAKE these medications       atorvastatin 10 MG tablet  Commonly known as:  LIPITOR  Take 1 tablet (10 mg total) by mouth daily at 6 PM. For treatment of your high cholesterol.     benzonatate 100 MG capsule  Commonly known as:  TESSALON  Take 1 capsule (100 mg total) by mouth 3 (three) times daily as needed for cough.     carBAMazepine 100 MG/5ML suspension  Commonly known as:  TEGRETOL  Take 100 mg by mouth 2 (two) times daily.     carbamazepine 200 MG tablet  Commonly known as:  TEGRETOL  Take 200 mg by mouth 4 (four) times daily.     enoxaparin 150 MG/ML injection  Commonly known as:  LOVENOX  Inject 1 mL (150 mg total) into the skin daily.     HYDROcodone-acetaminophen 5-325 MG per tablet  Commonly known as:  NORCO/VICODIN  Take 1-2 tablets by  mouth every 6 (six) hours as needed for severe pain.     lamoTRIgine 25 MG tablet  Commonly known as:  LAMICTAL  Take 1 tablet (25 mg total) by mouth 2 (two) times daily. New seizure medicine.     multivitamin with minerals Tabs tablet  Take 1 tablet by mouth daily.     potassium chloride SA 20 MEQ tablet  Commonly known as:  K-DUR,KLOR-CON  Take 20 mEq by mouth daily.       No Known Allergies     Follow-up Information    Follow up with Josue Hector, MD. Schedule an appointment as soon as possible for a visit in 1 week.   Specialty:  Family Medicine   Contact information:   [address] [place] [address] [phone]       Follow up with [facility], KOFI, MD. Schedule an appointment as soon as possible for a visit in 1 week.   Specialty:  Neurology   Contact information:   [address] [place] [address] [phone]        The results of significant diagnostics from this hospitalization (including imaging, microbiology, ancillary and laboratory) are listed below for reference.    Significant Diagnostic Studies: Ct Head Wo Contrast  05/20/2013   CLINICAL DATA:  Seizure, fall  EXAM: CT HEAD WITHOUT CONTRAST  TECHNIQUE: Contiguous axial images were obtained from the base of the skull through the vertex without intravenous contrast.  COMPARISON:  Prior CT from 07/01/2006.  Marland Kitchen  FINDINGS: Scattered and confluent hypodensity within the periventricular and deep white matter both cerebral hemispheres is consistent with mild chronic small vessel ischemic changes. Findings are most evident adjacent to the frontal horn of the right lateral ventricle. Increased nodularity along the margins of the lateral ventricles is suggestive of heterotopic gray matter (series 2, image 21).  There is no acute intracranial hemorrhage or infarct. No mass lesion or midline shift. Gray-white matter differentiation is well maintained. Ventricles are normal in size without evidence of hydrocephalus. CSF containing spaces are within normal limits. No extra-axial fluid collection.  The calvarium is intact.  Orbital soft tissues are within normal limits.  The paranasal sinuses and mastoid air cells are well pneumatized and free of fluid.  Scalp soft tissues are unremarkable.  IMPRESSION: 1. No acute intracranial abnormality. 2. Nodularity along the margins of the lateral ventricles bilaterally, suggestive of heterotopic  gray matter related to migrational abnormality. This finding is unchanged relative to prior CT from 06/30/2004. Follow-up examination with MRI would likely be helpful for further evaluation if further imaging is desired. 3. Moderate chronic small vessel ischemic disease.   Electronically Signed   By: Rise Mu M.D.   On: 05/20/2013 16:08   Ct Chest W Contrast  05/23/2013   CLINICAL DATA:  Pulmonary embolism  EXAM: CT CHEST WITH CONTRAST  TECHNIQUE: Multidetector CT imaging of the chest was performed during intravenous contrast administration.  CONTRAST:  80mL OMNIPAQUE IOHEXOL 300 MG/ML  SOLN  FINDINGS: Stable bilateral pulmonary thromboembolism.  No abnormal mediastinal adenopathy or mediastinal hemorrhage.  No pneumothorax.  No pleural effusion.  Lungs are under aerated. Patchy ground-glass bilaterally may simply be related to volume loss. An inflammatory process would be less likely. No findings to strongly suggest development of a pulmonary infarct.  IMPRESSION: Stable pulmonary thromboembolism.  Bilateral ground-glass opacities are most likely related to volume loss.   Electronically Signed   By: Maryclare Bean M.D.   On: 05/23/2013 12:52   Ct  Angio Chest Pe W/cm &/or Wo Cm  05/20/2013   CLINICAL DATA:  Several day history of chest pain and shortness of breath now with hypoxia and positive D-dimer  EXAM: CT ANGIOGRAPHY CHEST WITH CONTRAST  TECHNIQUE: Multidetector CT imaging of the chest was performed using the standard protocol during bolus administration of intravenous contrast. Multiplanar CT image reconstructions and MIPs were obtained to evaluate the vascular anatomy.  CONTRAST:  100mL OMNIPAQUE IOHEXOL 350 MG/ML SOLN  COMPARISON:  DG CHEST 1V PORT dated 05/20/2013  FINDINGS: There are filling defects within the right and left main pulmonary arteries at their proximal branches consistent with acute pulmonary emboli. There is dilation of the right ventricle with an abnormal RV-LV ratio of 1.6.  There is no significant pericardial effusion and no pleural effusion. There is no mediastinal or hilar lymphadenopathy. The caliber of the thoracic aorta is normal. There is no evidence of a false lumen.  At lung window settings very mildly increased interstitial density is present in both lungs. There is no alveolar infiltrate or findings to suggest pulmonary infarction. No pulmonary parenchymal nodules are demonstrated.  Within the upper abdomen there is an approximately 5 mm diameter calcification in the right hepatic lobe. The observed portions of the spleen and adrenal glands are grossly normal. The  Review of the MIP images confirms the above findings.  IMPRESSION: 1. There are new large volume bilateral pulmonary emboli. There is CTevidence of right heart strain (RV/LV Ratio = 1.6) consistent with at least submassive (intermediate risk) PE. The presence of right heart strain has been associated with an increased risk of morbidity and mortality. 2. There is no CT evidence of acute pulmonary infarction. 3. There is no pleural nor significant pericardial effusion. 4. These results were called by telephone at the time of interpretation on 05/20/2013 at 11:25 PM to Alfonse Flavorsaroline Webb, RN, who verbally acknowledged these results.   Electronically Signed   By: David  SwazilandJordan   On: 05/20/2013 23:28   [place] Carotid Bilateral  05/21/2013   CLINICAL DATA:  Syncope  EXAM: BILATERAL CAROTID DUPLEX ULTRASOUND  TECHNIQUE: Wallace CullensGray scale imaging, color Doppler and duplex ultrasound were performed of bilateral carotid and vertebral arteries in the neck.  COMPARISON:  None.  FINDINGS: Criteria: Quantification of carotid stenosis is based on velocity parameters that correlate the residual internal carotid diameter with NASCET-based stenosis levels, using the diameter of the distal internal carotid lumen as the denominator for stenosis measurement.  The following velocity measurements were obtained:  RIGHT  ICA:  72/29 cm/sec  CCA:  51/11  cm/sec  SYSTOLIC ICA/CCA RATIO:  1.41  DIASTOLIC ICA/CCA RATIO:  2.50  ECA:  97 cm/sec  LEFT  ICA:  68/22 cm/sec  CCA:  72/12 cm/sec  SYSTOLIC ICA/CCA RATIO:  0.94  DIASTOLIC ICA/CCA RATIO:  1.84  ECA:  65 cm/sec  RIGHT CAROTID ARTERY: Minor echogenic shadowing plaque formation. No hemodynamically significant right ICA stenosis, velocity elevation, or turbulent flow. Degree of narrowing less than 50%.  RIGHT VERTEBRAL ARTERY:  Antegrade  LEFT CAROTID ARTERY: Similar scattered minor echogenic plaque formation. No hemodynamically significant left ICA stenosis, velocity elevation, or turbulent flow.  LEFT VERTEBRAL ARTERY:  Antegrade  IMPRESSION: Minor carotid atherosclerosis. No hemodynamically significant ICA stenosis by ultrasound.   Electronically Signed   By: Ruel Favorsrevor  Shick M.D.   On: 05/21/2013 11:11   [place] Venous Img Lower Bilateral  05/21/2013   CLINICAL DATA:  Syncope  EXAM: Bilateral LOWER EXTREMITY VENOUS DUPLEX ULTRASOUND  TECHNIQUE: Doppler  venous assessment of the bilateral lower extremity deep venous system was performed, including characterization of spectral flow, compressibility, and phasicity.  COMPARISON:  None.  FINDINGS: There is complete compressibility of the bilateral common femoral, femoral, and popliteal veins. Doppler analysis demonstrates respiratory phasicity and augmentation of flow upon calf compression. No obvious superficial vein or calf vein thrombosis.  IMPRESSION: No evidence of lower extremity DVT.   Electronically Signed   By: Maryclare Bean M.D.   On: 05/21/2013 16:22   Dg Chest Portable 1 View  05/20/2013   CLINICAL DATA:  Loss of consciousness, patient's of breathing according to the mother, history of seizures  EXAM: PORTABLE CHEST - 1 VIEW  COMPARISON:  None.  FINDINGS: The heart size and mediastinal contours are within normal limits. There is prominence of the right hilum likely representing prominent pulmonary vasculature. Both lungs are clear. The visualized skeletal  structures are unremarkable.  IMPRESSION: No active disease.   Electronically Signed   By: Elige Ko   On: 05/20/2013 14:05    Microbiology: Recent Results (from the past 240 hour(s))  MRSA PCR SCREENING     Status: None   Collection Time    05/21/13  3:38 AM      Result Value Ref Range Status   MRSA by PCR NEGATIVE  NEGATIVE Final   Comment:            The GeneXpert MRSA Assay (FDA     approved for NASAL specimens     only), is one component of a     comprehensive MRSA colonization     surveillance program. It is not     intended to diagnose MRSA     infection nor to guide or     monitor treatment for     MRSA infections.  URINE CULTURE     Status: None   Collection Time    05/21/13  7:00 PM      Result Value Ref Range Status   Specimen Description URINE, CLEAN CATCH   Final   Special Requests NONE   Final   Culture  Setup Time     Final   Value: 05/21/2013 20:05     Performed at [facility] Count     Final   Value: >=100,000 COLONIES/ML     Performed at [facility]   Culture     Final   Value: Multiple bacterial morphotypes present, none predominant. Suggest appropriate recollection if clinically indicated.     Performed at [facility]   Report Status 05/23/2013 FINAL   Final     Labs: Basic Metabolic Panel:  Recent Labs Lab 05/20/13 1351 05/22/13 0534  NA 144 142  K 4.1 4.0  CL 106 108  CO2 24 21  GLUCOSE 129* 112*  BUN 21 15  CREATININE 0.65 0.49*  CALCIUM 8.9 8.1*   Liver Function Tests: No results found for this basename: AST, ALT, ALKPHOS, BILITOT, PROT, ALBUMIN,  in the last 168 hours No results found for this basename: LIPASE, AMYLASE,  in the last 168 hours No results found for this basename: AMMONIA,  in the last 168 hours CBC:  Recent Labs Lab 05/20/13 1351 05/21/13 0841 05/22/13 0534 05/24/13 0456  WBC 11.0* 10.8* 9.6 8.4  NEUTROABS 8.8*  --   --   --   HGB 14.7 13.3 12.6 11.9*  HCT 44.2 40.1 38.5  37.0  MCV 96.1 96.9 96.3 96.9  PLT 170 152 152  154   Cardiac Enzymes:  Recent Labs Lab 05/20/13 1351 05/21/13 0216 05/21/13 0841 05/24/13 0854  CKTOTAL 120  --   --  103  CKMB 14.0*  --   --   --   TROPONINI 0.36* <0.30 <0.30  --    BNP: BNP (last 3 results) No results found for this basename: PROBNP,  in the last 8760 hours CBG: No results found for this basename: GLUCAP,  in the last 168 hours     Signed:  Elliot Cousin  [name] 05/25/2013, 10:22 AM

## 2013-05-25 NOTE — Progress Notes (Signed)
Patient's [facility] opinion [facility]  MD notified and felt patient was medically stable for d/c.  Patient's mother requesting d/c papers immediately bc [facility]. D/c instructions re viewed with patient and mother.   Verbalized understanding.  Pt dc' d to home with mother. Candelaria StagersLeigh Anne Schonewitz 05/25/2013

## 2013-05-27 LAB — PROTEIN S ACTIVITY: PROTEIN S ACTIVITY: 76 % (ref 69–129)

## 2013-05-27 LAB — PROTEIN C ACTIVITY: Protein C Activity: 123 % (ref 75–133)

## 2015-01-27 ENCOUNTER — Emergency Department (HOSPITAL_COMMUNITY)
Admission: EM | Admit: 2015-01-27 | Discharge: 2015-01-27 | Disposition: A | Discharge status: Home / Self Care | Payer: Medicare Other | Attending: Emergency Medicine | Admitting: Emergency Medicine

## 2015-01-27 ENCOUNTER — Emergency Department (HOSPITAL_COMMUNITY): Payer: Medicare Other

## 2015-01-27 ENCOUNTER — Encounter (HOSPITAL_COMMUNITY): Payer: Self-pay | Admitting: Emergency Medicine

## 2015-01-27 DIAGNOSIS — S82832A Other fracture of upper and lower end of left fibula, initial encounter for closed fracture: Secondary | ICD-10-CM | POA: Insufficient documentation

## 2015-01-27 DIAGNOSIS — E785 Hyperlipidemia, unspecified: Secondary | ICD-10-CM | POA: Diagnosis not present

## 2015-01-27 DIAGNOSIS — Z8679 Personal history of other diseases of the circulatory system: Secondary | ICD-10-CM | POA: Insufficient documentation

## 2015-01-27 DIAGNOSIS — S99912A Unspecified injury of left ankle, initial encounter: Secondary | ICD-10-CM | POA: Diagnosis present

## 2015-01-27 DIAGNOSIS — S82892A Other fracture of left lower leg, initial encounter for closed fracture: Secondary | ICD-10-CM

## 2015-01-27 DIAGNOSIS — Y998 Other external cause status: Secondary | ICD-10-CM | POA: Diagnosis not present

## 2015-01-27 DIAGNOSIS — Z79899 Other long term (current) drug therapy: Secondary | ICD-10-CM | POA: Insufficient documentation

## 2015-01-27 DIAGNOSIS — Q049 Congenital malformation of brain, unspecified: Secondary | ICD-10-CM | POA: Diagnosis not present

## 2015-01-27 DIAGNOSIS — Z86711 Personal history of pulmonary embolism: Secondary | ICD-10-CM | POA: Insufficient documentation

## 2015-01-27 DIAGNOSIS — Y9389 Activity, other specified: Secondary | ICD-10-CM | POA: Diagnosis not present

## 2015-01-27 DIAGNOSIS — Z7901 Long term (current) use of anticoagulants: Secondary | ICD-10-CM | POA: Diagnosis not present

## 2015-01-27 DIAGNOSIS — Y9289 Other specified places as the place of occurrence of the external cause: Secondary | ICD-10-CM | POA: Insufficient documentation

## 2015-01-27 DIAGNOSIS — W010XXA Fall on same level from slipping, tripping and stumbling without subsequent striking against object, initial encounter: Secondary | ICD-10-CM | POA: Diagnosis not present

## 2015-01-27 MED ORDER — HYDROCODONE-ACETAMINOPHEN 5-325 MG PO TABS: ORAL_TABLET | ORAL | Status: DC

## 2015-01-27 NOTE — Discharge Instructions (Signed)
Rest, Ice intermittently (in the first 24-48 hours), Gentle compression with an Ace wrap, and elevate (Limb above the level of the heart)   Do not weight bear until you are seen by your orthopedist.  Take vicodin for breakthrough pain, do not drink alcohol, drive, care for children or do other critical tasks while taking vicodin.  Please be very careful not to fall! The pain medication and  puts you at risk for falls. Please rest as much as possible and try to not stay alone. He fall and hit her head you must come to the emergency room immediately for evaluation.   Please follow with your primary care doctor in the next 2 days for a check-up. They must obtain records for further management.   Do not hesitate to return to the Emergency Department for any new, worsening or concerning symptoms.   Fibular Fracture With Rehab The fibula is the smaller of the two lower leg bones and is vulnerable to breaks (fracture). Fibular fractures may go fully through the bone (complete) or partially (incomplete). The bone fragments are rarely out of alignment (displaced fracture). Fibula fractures may occur anywhere along the bone. However, this document only discusses fractures that do not involve a leg joint. Fibular fractures are not often a severe injury because the bone supports only about 17% of the body weight. SYMPTOMS   Moderate to severe pain in the lower leg.  Tenderness and swelling in the leg or calf.  Bleeding and/or bruising (contusion) in the leg.  Inability to bear weight on the injured extremity.  Visible deformity, if the fracture is displaced.  Numbness and coldness in the leg and foot, beyond the fracture site, if blood supply is impaired. CAUSES  Fractures occur when a force is placed on the bone that is greater than it can withstand. Common causes of fibular fracture include:  Direct hit (trauma) (i.e., hockey or lacrosse check to the lower leg).  Stress fracture (weakening  of the bone from repeated stress).  Indirect injury, caused by twisting, turning quickly, or violent muscle contraction. RISK INCREASES WITH:  Contact sports (i.e., football, soccer, lacrosse, hockey).  Sports that can cause twisted ankle injury (i.e., skiing, basketball).  Bony abnormalities (i.e., osteoporosis or bone tumors).  Metabolism disorders, hormone problems, and nutrition deficiency and disorders (i.e., anorexia and bulimia).  Poor strength and flexibility. PREVENTION   Warm up and stretch properly before activity.  Maintain physical fitness:  Strength, flexibility, and endurance.  Cardiovascular fitness.  Wear properly fitted and padded protective equipment (i.e., shin guards for soccer). PROGNOSIS  If treated properly, fibular fractures usually heal in 4 to 6 weeks.  RELATED COMPLICATIONS   Failure of bone to heal (nonunion).  Bone heals in a poor position (malunion).  Increased pressure inside the leg (compartment syndrome) due to injury that disrupts the blood supply to the leg and foot and injures the nerves and muscles (uncommon).  Shortening of the injured bones.  Hindrance of normal bone growth in children.  Risks of surgery: infection, bleeding, injury to nerves (numbness, weakness, paralysis), need for further surgery.  Longer healing time if activity is resumed too soon. TREATMENT Treatment first involves ice, medicine, and elevation of the leg to reduce pain and inflammation. People with fibular fractures are advised to walk using crutches. A brace or walking boot may be given to restrain the injured leg and allow for healing. Sometimes, surgery is needed to place a rod, plate, or screws in the bones in order  to fix the fracture. After surgery, the leg is restrained. After restraint (with or without surgery), it is important to complete strengthening and stretching exercises to regain strength and a full range of motion. Exercises may be completed at  home or with a therapist. MEDICATION   If pain medicine is needed, nonsteroidal anti-inflammatory medicines (aspirin and ibuprofen), or other minor pain relievers (acetaminophen), are often advised.  Do not take pain medicine for 7 days before surgery.  Prescription pain relievers may be given if your health care provider thinks they are needed. Use only as directed and only as much as you need. SEEK MEDICAL CARE IF:  Symptoms get worse or do not improve in 2 weeks, despite treatment.  The following occur after restraint or surgery. (Report any of these signs immediately):  Swelling above or below the fracture site.  Severe, persistent pain.  Blue or gray skin below the fracture site, especially under the toenails. Numbness or loss of feeling below the fracture site.  New, unexplained symptoms develop. (Drugs used in treatment may produce side effects.) EXERCISES  RANGE OF MOTION (ROM) AND STRETCHING EXERCISES - Fibular Fracture These exercises may help you when beginning to recover from your injury. Your symptoms may go away with or without further involvement from your physician, physical therapist or athletic trainer. While completing these exercises, remember:   Restoring tissue flexibility helps normal motion to return to the joints. This allows healthier, less painful movement and activity.  An effective stretch should be held for at least 30 seconds.  A stretch should never be painful. You should only feel a gentle lengthening or release in the stretched tissue. RANGE OF MOTION - Dorsi/Plantar Flexion  While sitting with your right / left knee straight, draw the top of your foot upwards by flexing your ankle. Then reverse the motion, pointing your toes downward.  Hold each position for __________ seconds.  After completing your first set of exercises, repeat this exercise with your knee bent. Repeat __________ times. Complete this exercise __________ times per day.    STRETCH - Gastrocsoleus   Sit with your right / left leg extended. Holding onto both ends of a belt or towel, loop it around the ball of your foot.  Keeping your right / left ankle and foot relaxed and your knee straight, pull your foot and ankle toward you using the belt.  You should feel a gentle stretch behind your calf or knee. Hold this position for __________ seconds. Repeat __________ times. Complete this stretch __________ times per day.  RANGE OF MOTION- Ankle Plantar Flexion   Sit with your right / left leg crossed over your opposite knee.  Use your opposite hand to pull the top of your foot and toes toward you.  You should feel a gentle stretch on the top of your foot and ankle. Hold this position for __________ seconds. Repeat __________ times. Complete __________ times per day.  RANGE OF MOTION - Ankle Eversion  Sit with your right / left ankle crossed over your opposite knee.  Grip your foot with your opposite hand, placing your thumb on the top of your foot and your fingers across the bottom of your foot.  Gently push your foot downward with a slight rotation so your littlest toes rise slightly toward the ceiling.  You should feel a gentle stretch on the inside of your ankle. Hold the stretch for __________ seconds. Repeat __________ times. Complete this exercise __________ times per day.  RANGE OF MOTION -  Ankle Inversion  Sit with your right / left ankle crossed over your opposite knee.  Grip your foot with your opposite hand, placing your thumb on the bottom of your foot and your fingers across the top of your foot.  Gently pull your foot so the smallest toe comes toward you and your thumb pushes the inside of the ball of your foot away from you.  You should feel a gentle stretch on the outside of your ankle. Hold the stretch for __________ seconds. Repeat __________ times. Complete this exercise __________ times per day.  RANGE OF MOTION - Ankle  Alphabet  Imagine your right / left big toe is a pen.  Keeping your hip and knee still, write out the entire alphabet with your "pen." Make the letters as large as you can, without increasing any discomfort. Repeat __________ times. Complete this exercise __________ times per day.  RANGE OF MOTION - Ankle Dorsiflexion, Active Assisted  Remove your shoes and sit on a chair, preferably not on a carpeted surface.  Place your right / left foot on the floor, directly under your knee. Extend your opposite leg for support.  Keeping your heel down, slide your right / left foot back toward the chair, until you feel a stretch at your ankle or calf. If you do not feel a stretch, slide your bottom forward to the edge of the chair, while still keeping your heel down.  Hold this stretch for __________ seconds. Repeat __________ times. Complete this stretch __________ times per day.  STRENGTHENING EXERCISES - Fibular Fracture These exercises may help you when beginning to recover from your injury. They may resolve your symptoms with or without further involvement from your physician, physical therapist or athletic trainer. While completing these exercises, remember:   Muscles can gain both the endurance and the strength needed for everyday activities through controlled exercises.  Complete these exercises as instructed by your physician, physical therapist or athletic trainer. Increase the resistance and repetitions only as guided.  You may experience muscle soreness or fatigue, but the pain or discomfort you are trying to eliminate should never worsen during these exercises. If this pain does get worse, stop and make certain you are following the directions exactly. If the pain is still present after adjustments, discontinue the exercise until you can discuss the trouble with your clinician. STRENGTH - Dorsiflexors  Secure a rubber exercise band or tubing to a fixed object (table, pole) and loop the  other end around your right / left foot.  Sit on the floor, facing the fixed object. The band should be slightly tense when your foot is relaxed.  Slowly draw your foot back toward you, using your ankle and toes.  Hold this position for __________ seconds. Slowly release the tension in the band and return your foot to the starting position. Repeat __________ times. Complete this exercise __________ times per day.  STRENGTH - Plantar-flexors  Sit with your right / left leg extended. Holding onto both ends of a rubber exercise band or tubing, loop it around the ball of your foot. Keep a slight tension in the band.  Slowly push your toes away from you, pointing them downward.  Hold this position for __________ seconds. Return to the starting position slowly, controlling the tension in the band. Repeat __________ times. Complete this exercise __________ times per day.  STRENGTH - Plantar-flexors, Standing   Stand with your feet shoulder width apart. Place your hands on a wall or table to steady  yourself, using as little support as needed.  Keeping your weight evenly spread over the width of your feet, rise up on your toes.*  Hold this position for __________ seconds. Repeat __________ times. Complete this exercise __________ times per day.  *If this is too easy, shift your weight toward your right / left leg until you feel challenged. Ultimately, you may be asked to do this exercise while standing on your right / left foot only. STRENGTH - Towel Curls  Sit in a chair, on a non-carpeted surface.  Place your foot on a towel, keeping your heel on the floor.  Pull the towel toward your heel only by curling your toes. Keep your heel on the floor.  If instructed by your physician, physical therapist or athletic trainer, add ____________________ at the end of the towel. Repeat __________ times. Complete this exercise __________ times per day. STRENGTH - Ankle Eversion  Secure one end of a  rubber exercise band or tubing to a fixed object (table, pole). Loop the other end around your foot, just before your toes.  Place your fists between your knees. This will focus your strengthening at your ankle.  Drawing the band across your opposite foot, away from the pole, slowly pull your little toe out and up. Make sure the band is positioned to resist the entire motion.  Hold this position for __________ seconds.  Return to the starting position slowly, controlling the tension in the band. Repeat __________ times. Complete this exercise __________ times per day.  STRENGTH - Ankle Inversion  Secure one end of a rubber exercise band or tubing to a fixed object (table, pole). Loop the other end around your foot, just before your toes.  Place your fists between your knees. This will focus your strengthening at your ankle.  Slowly, pull your big toe up and in, making sure the band is positioned to resist the entire motion.  Hold this position for __________ seconds.  Return to the starting position slowly, controlling the tension in the band. Repeat __________ times. Complete this exercises __________ times per day.    This information is not intended to replace advice given to you by your health care provider. Make sure you discuss any questions you have with your health care provider.   Document Released: 01/10/2005 Document Revised: 01/31/2014 Document Reviewed: 04/24/2008 Elsevier Interactive Patient Education [employer]

## 2015-01-27 NOTE — ED Notes (Signed)
Patient presents for fall, reports slipping and fall on restaurant floor, denies LOC, c/o left ankle pain, tender to palpation, swelling noted to same, takes Warfarin for PE.

## 2015-01-27 NOTE — ED Provider Notes (Signed)
CSN: [id number]     Arrival date & time 01/27/15  1452 History  By signing my name below, I, Amber Warner, attest that this documentation has been prepared under the direction and in the presence of Amber Emery, PA-C Electronically Signed: Charline Warner, ED Scribe 01/27/2015 at 4:12 PM.   Chief Complaint  Patient presents with  . Fall  . Ankle Pain   The history is provided by the patient. No language interpreter was used.   HPI Comments: Amber Warner is a 50 y.o. female who presents to the Emergency Department complaining of a fall that occurred PTA. Pt states that she slipped on a wet floor at [facility] and rolled her left ankle during the incident. No head injury or LOC. She reports constant, 10/10 pain that is exacerbated with palpation, bending and bearing weight. She also reports associated swelling to the area since the incident. At baseline, pt ambulates with a walker but she also has a wheelchair. She is currently on Warfarin for PE. Pt does not have an orthopedic physician.   Past Medical History  Diagnosis Date  . Seizures (HCC)   . Pulmonary hypertension (HCC) 05/22/2013  . Bilateral pulmonary embolism (HCC) 05/21/2013  . 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 Hyperlipidemia 05/25/2013  . 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 Hemorrhoid surgery     Family History  Problem Relation Age of Onset  . Hypertension Mother   . Diabetes Mellitus II Maternal Aunt   . Stroke Father   . Heart attack Father 33    died at age 42   Social History  Substance Use Topics  . Smoking status: Never Smoker   . Smokeless tobacco: Never Used  . Alcohol Use: No   OB History    No data available     Review of Systems A complete 10 system review of systems was obtained and all systems are negative except as noted in the HPI and PMH.   Allergies  Review of patient's allergies indicates no  known allergies.  Home Medications   Prior to Admission medications   Medication Sig Start Date End Date Taking? Authorizing Provider  atorvastatin (LIPITOR) 10 MG tablet Take 1 tablet (10 mg total) by mouth daily at 6 PM. For treatment of your high cholesterol. 05/25/13   Elliot Cousin, MD  benzonatate (TESSALON) 100 MG capsule Take 1 capsule (100 mg total) by mouth 3 (three) times daily as needed for cough. 05/25/13   Elliot Cousin, MD  carBAMazepine (TEGRETOL) 100 MG/5ML suspension Take 100 mg by mouth 2 (two) times daily.    Historical Provider, MD  carbamazepine (TEGRETOL) 200 MG tablet Take 200 mg by mouth 4 (four) times daily.    Historical Provider, MD  enoxaparin (LOVENOX) 150 MG/ML injection Inject 1 mL (150 mg total) into the skin daily. 05/25/13   Elliot Cousin, MD  HYDROcodone-acetaminophen (NORCO/VICODIN) 5-325 MG per tablet Take 1-2 tablets by mouth every 6 (six) hours as needed for severe pain. 05/25/13   Elliot Cousin, MD  lamoTRIgine (LAMICTAL) 25 MG tablet Take 1 tablet (25 mg total) by mouth 2 (two) times daily. New seizure medicine. 05/25/13   Elliot Cousin, MD  Multiple Vitamin (MULTIVITAMIN WITH MINERALS) TABS tablet Take 1 tablet by mouth daily.    Historical Provider, MD  potassium chloride SA (K-DUR,KLOR-CON) 20 MEQ tablet Take 20 mEq by mouth daily.    Historical Provider, MD  BP 130/75 mmHg  Pulse 73  Temp(Src) 98.4 F (36.9 C) (Oral)  Resp 18  SpO2 94%  LMP 01/10/2015 Physical Exam  Constitutional: She is oriented to person, place, and time. She appears well-developed and well-nourished. No distress.  HENT:  Head: Normocephalic and atraumatic.  Mouth/Throat: Oropharynx is clear and moist.  No abrasions or contusions.   No hemotympanum, battle signs or raccoon's eyes  No crepitance or tenderness to palpation along the orbital rim.  EOMI intact with no pain or diplopia  No abnormal otorrhea or rhinorrhea. Nasal septum midline.  No intraoral trauma.  Eyes:  Conjunctivae and EOM are normal. Pupils are equal, round, and reactive to light.  Neck: Normal range of motion. Neck supple. No tracheal deviation present.  No midline C-spine  tenderness to palpation or step-offs appreciated. Patient has full range of motion without pain.  Grip/Biceps/Tricep strength 5/5 bilaterally, sensation to UE intact bilaterally.    Cardiovascular: Normal rate, regular rhythm and intact distal pulses.   Pulmonary/Chest: Effort normal and breath sounds normal. No respiratory distress. She has no wheezes. She has no rales. She exhibits no tenderness.  No TTP or crepitance  Abdominal: Soft. Bowel sounds are normal. She exhibits no distension and no mass. There is no tenderness. There is no rebound and no guarding.  Musculoskeletal: Normal range of motion. She exhibits edema and tenderness.  Mason ecchymosis to left ankle, DP and PT pulses are 2+. Cap refill brisk 5. Diffusely tender to palpation along the medial and lateral malleolus. Excellent range of motion to toes and can differentiate between pinprick and light touch. No tenderness palpation of knees, hips, lumbar spine, C-spine  Neurological: She is alert and oriented to person, place, and time.  Strength 5/5 x4 extremities   Distal sensation intact  Skin: Skin is warm and dry.  Psychiatric: She has a normal mood and affect. Her behavior is normal.  Nursing note and vitals reviewed.  ED Course  Procedures (including critical care time)  SPLINT APPLICATION Date/Time: 5:48 PM Authorized by: Amber Warner Consent: Verbal consent obtained. Risks and benefits: risks, benefits and alternatives were discussed Consent given by: patient Splint applied by: orthopedic technician Location details: Left ankle  Splint type: Stirrup splint  Supplies used: Ortho-Glass  Post-procedure: The splinted body part was neurovascularly unchanged following the procedure. Patient tolerance: Patient tolerated the procedure well  with no immediate complications.    DIAGNOSTIC STUDIES: Oxygen Saturation is 94% on RA, adequate by my interpretation.    COORDINATION OF CARE: 4:00 PM-Discussed treatment plan which includes XR and follow-up with ortho with pt at bedside and pt agreed to plan.   Labs Review Labs Reviewed - No data to display  Imaging Review Dg Ankle Complete Left  01/27/2015  CLINICAL DATA:  Injury EXAM: LEFT ANKLE COMPLETE - 3+ VIEW COMPARISON:  None. FINDINGS: There is an oblique fracture of the distal fibula, well above the tibial plafond. There is cortical step-off along the tibial plafond worrisome for an intra-articular fracture of the distal tibia. Tiny fragments project inferior to the medial malleolus worrisome for avulsion fractures. The fracture of the posterior malleolus is visualized on the lateral view. Spurring of the inferior and posterior calcaneus. IMPRESSION: Distal fibular fracture above the tibial plafond. Intra-articular distal tibia fracture as described. Weber C injury. Electronically Signed   By: Jolaine Click M.D.   On: 01/27/2015 15:38   Dg Foot Complete Left  01/27/2015  CLINICAL DATA:  Fall, ankle pain. Swelling throughout left ankle. Pain  and swelling extends into the left foot. EXAM: LEFT FOOT - COMPLETE 3+ VIEW COMPARISON:  Ankle series performed today. FINDINGS: Small avulsed fragments noted off the tip of the medial malleolus. The distal fibular fracture is partially imaged, better seen on the ankle series. No acute bony abnormality within the foot. Plantar calcaneal spur. IMPRESSION: Distal fibular fracture and avulsion off the tip of the medial malleolus again noted, better seen on the ankle series. No acute bony abnormality within the foot. Electronically Signed   By: Charlett NoseKevin  Dover M.D.   On: 01/27/2015 15:38   I have personally reviewed and evaluated these images and lab results as part of my medical decision-making.   EKG Interpretation None      MDM   Final diagnoses:   Ankle fracture, left, closed, initial encounter    Filed Vitals:   01/27/15 1506 01/27/15 1710  BP: 130/75   Pulse: 73 72  Temp: 98.4 F (36.9 C)   TempSrc: Oral   Resp: 18   SpO2: 94%     Alric QuanMichelle A Wale is [age] y.o. female presenting with ankle pain status post mechanical fall, she is anticoagulated, neurovascularly intact, this is a closed injury. X-ray shows a fibular fracture. Patient is placed in a stirrup splint and given orthopedic referral. Patient is anticoagulated, but advised her that the pain medication and splint may make her more prone to fall. She lives with her sister who is her caregiver, the eyes and that there is any head trauma that will need to present to the ED immediately.  Discussed case with attending physician who agrees with care plan and disposition.   Evaluation does not show pathology that would require ongoing emergent intervention or inpatient treatment. Pt is hemodynamically stable and mentating appropriately. Discussed findings and plan with patient/guardian, who agrees with care plan. All questions answered. Return precautions discussed and outpatient follow up given.   I personally performed the services described in this documentation, which was scribed in my presence. The recorded information has been reviewed and is accurate.        Amber Emeryicole Chayse Zatarain, PA-C 01/27/15 1748  Vanetta MuldersScott Zackowski, MD 01/29/15 2001

## 2015-01-29 ENCOUNTER — Emergency Department (HOSPITAL_COMMUNITY)
Admission: EM | Admit: 2015-01-29 | Discharge: 2015-01-29 | Disposition: A | Discharge status: Home / Self Care | Payer: Medicare Other | Attending: Emergency Medicine | Admitting: Emergency Medicine

## 2015-01-29 ENCOUNTER — Emergency Department (HOSPITAL_COMMUNITY): Payer: Medicare Other

## 2015-01-29 ENCOUNTER — Encounter (HOSPITAL_COMMUNITY): Payer: Self-pay | Admitting: Emergency Medicine

## 2015-01-29 DIAGNOSIS — Z86711 Personal history of pulmonary embolism: Secondary | ICD-10-CM | POA: Insufficient documentation

## 2015-01-29 DIAGNOSIS — M79662 Pain in left lower leg: Secondary | ICD-10-CM | POA: Diagnosis present

## 2015-01-29 DIAGNOSIS — Z8679 Personal history of other diseases of the circulatory system: Secondary | ICD-10-CM | POA: Insufficient documentation

## 2015-01-29 DIAGNOSIS — Q049 Congenital malformation of brain, unspecified: Secondary | ICD-10-CM | POA: Diagnosis not present

## 2015-01-29 DIAGNOSIS — E785 Hyperlipidemia, unspecified: Secondary | ICD-10-CM | POA: Insufficient documentation

## 2015-01-29 DIAGNOSIS — S82832D Other fracture of upper and lower end of left fibula, subsequent encounter for closed fracture with routine healing: Secondary | ICD-10-CM | POA: Insufficient documentation

## 2015-01-29 DIAGNOSIS — R791 Abnormal coagulation profile: Secondary | ICD-10-CM

## 2015-01-29 DIAGNOSIS — Z79899 Other long term (current) drug therapy: Secondary | ICD-10-CM | POA: Insufficient documentation

## 2015-01-29 DIAGNOSIS — S82402D Unspecified fracture of shaft of left fibula, subsequent encounter for closed fracture with routine healing: Secondary | ICD-10-CM

## 2015-01-29 DIAGNOSIS — W1839XD Other fall on same level, subsequent encounter: Secondary | ICD-10-CM | POA: Insufficient documentation

## 2015-01-29 LAB — PROTIME-INR
INR: 3.22 — AB (ref 0.00–1.49)
Prothrombin Time: 32.2 seconds — ABNORMAL HIGH (ref 11.6–15.2)

## 2015-01-29 NOTE — ED Provider Notes (Signed)
CSN: [id number]     Arrival date & time 01/29/15  1237 History   First MD Initiated Contact with Patient 01/29/15 1622     Chief Complaint  Patient presents with  . Fall     (Consider location/radiation/quality/duration/timing/severity/associated sxs/prior Treatment) HPI Comments: Amber Warner is a 50 y.o. female with a PMHx of seizures, pulm HTN, PE on coumadin, and HLD, who presents to the ED with complaints of mechanical fall sustained around 9:30 this morning when she was using her walker to ambulate and it broke, causing her to fall on her left ankle which is in a splint. She states that she landed on her left leg and then sat down on the ground, denies any head injury or loss of consciousness. She states that prior to the fall she is on her way to a primary care doctor appointment to have her Coumadin level checked. She states that now she has 10/10 constant sharp left lower leg pain which is nonradiating, worse with pain in the leg in a dependent position, and improved with home Vicodin. She denies any numbness, tingling, focal weakness, loss of range of motion of the toes, headache or vision changes, syncope, chest pain, shortness breath, abdominal pain, nausea vomiting, dysuria, or hematuria. No other injury sustained during the fall.  Patient is a 50 y.o. female presenting with fall. The history is provided by the patient and medical records. No language interpreter was used.  Fall This is a new problem. The current episode started today. The problem occurs rarely. The problem has been unchanged. Associated symptoms include arthralgias. Pertinent negatives include no abdominal pain, chest pain, myalgias, nausea, numbness, visual change, vomiting or weakness. Nothing aggravates the symptoms. She has tried nothing for the symptoms. The treatment provided no relief.    Past Medical History  Diagnosis Date  . Seizures (HCC)   . Pulmonary hypertension (HCC) 05/22/2013  . Bilateral  pulmonary embolism (HCC) 05/21/2013  . 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 Hyperlipidemia 05/25/2013  . 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 Hemorrhoid surgery     Family History  Problem Relation Age of Onset  . Hypertension Mother   . Diabetes Mellitus II Maternal Aunt   . Stroke Father   . Heart attack Father 28    died at age 31   Social History  Substance Use Topics  . Smoking status: Never Smoker   . Smokeless tobacco: Never Used  . Alcohol Use: No   OB History    No data available     Review of Systems  HENT: Negative for facial swelling (no head inj).   Respiratory: Negative for shortness of breath.   Cardiovascular: Negative for chest pain.  Gastrointestinal: Negative for nausea, vomiting and abdominal pain.  Genitourinary: Negative for dysuria and hematuria.  Musculoskeletal: Positive for arthralgias. Negative for myalgias.  Skin: Negative for color change and wound.  Allergic/Immunologic: Negative for immunocompromised state.  Neurological: Negative for syncope, weakness and numbness.  Psychiatric/Behavioral: Negative for confusion.   10 Systems reviewed and are negative for acute change except as noted in the HPI.    Allergies  Review of patient's allergies indicates no known allergies.  Home Medications   Prior to Admission medications   Medication Sig Start Date End Date Taking? Authorizing Provider  atorvastatin (LIPITOR) 10 MG tablet Take 1 tablet (10 mg total) by mouth daily at 6 PM. For treatment of your high  cholesterol. 05/25/13   Elliot Cousin, MD  benzonatate (TESSALON) 100 MG capsule Take 1 capsule (100 mg total) by mouth 3 (three) times daily as needed for cough. 05/25/13   Elliot Cousin, MD  carBAMazepine (TEGRETOL) 100 MG/5ML suspension Take 100 mg by mouth 2 (two) times daily.    Historical Provider, MD  carbamazepine (TEGRETOL) 200 MG tablet Take 200 mg by  mouth 4 (four) times daily.    Historical Provider, MD  enoxaparin (LOVENOX) 150 MG/ML injection Inject 1 mL (150 mg total) into the skin daily. 05/25/13   Elliot Cousin, MD  HYDROcodone-acetaminophen (NORCO/VICODIN) 5-325 MG tablet Take 1-2 tablets by mouth every 6 hours as needed for pain. 01/27/15   Nicole Pisciotta, PA-C  lamoTRIgine (LAMICTAL) 25 MG tablet Take 1 tablet (25 mg total) by mouth 2 (two) times daily. New seizure medicine. 05/25/13   Elliot Cousin, MD  Multiple Vitamin (MULTIVITAMIN WITH MINERALS) TABS tablet Take 1 tablet by mouth daily.    Historical Provider, MD  potassium chloride SA (K-DUR,KLOR-CON) 20 MEQ tablet Take 20 mEq by mouth daily.    Historical Provider, MD   BP 138/79 mmHg  Pulse 79  Temp(Src) 98.1 F (36.7 C) (Oral)  Resp 18  SpO2 93%  LMP 01/10/2015 Physical Exam  Constitutional: She is oriented to person, place, and time. Vital signs are normal. She appears well-developed and well-nourished.  Non-toxic appearance. No distress.  Afebrile, nontoxic, NAD  HENT:  Head: Normocephalic and atraumatic.  Mouth/Throat: Oropharynx is clear and moist and mucous membranes are normal.  Eyes: Conjunctivae and EOM are normal. Right eye exhibits no discharge. Left eye exhibits no discharge.  Neck: Normal range of motion. Neck supple.  Cardiovascular: Normal rate, regular rhythm, normal heart sounds and intact distal pulses.  Exam reveals no gallop and no friction rub.   No murmur heard. Pulmonary/Chest: Effort normal and breath sounds normal. No respiratory distress. She has no decreased breath sounds. She has no wheezes. She has no rhonchi. She has no rales.  Abdominal: Soft. Normal appearance and bowel sounds are normal. She exhibits no distension. There is no tenderness. There is no rigidity, no rebound and no guarding.  Musculoskeletal: Normal range of motion.  LLE splinted with splint intact, pt wiggling toes with ease, toes warm and with DP pulse intact, cap refill brisk  and present, sensation grossly intact. No tenderness to all other extremities or L hip/knee.   Neurological: She is alert and oriented to person, place, and time. She has normal strength. No sensory deficit.  Skin: Skin is warm, dry and intact. No rash noted.  Psychiatric: She has a normal mood and affect.  Nursing note and vitals reviewed.   ED Course  Procedures (including critical care time) Labs Review Labs Reviewed  PROTIME-INR - Abnormal; Notable for the following:    Prothrombin Time 32.2 (*)    INR 3.22 (*)    All other components within normal limits    Imaging Review Dg Tibia/fibula Left  01/29/2015  CLINICAL DATA:  Recent distal fibular fracture with immobilization EXAM: LEFT TIBIA AND FIBULA - 2 VIEW COMPARISON:  January 27, 2015 left ankle images including distal tibia and fibula FINDINGS: Frontal and lateral views obtained. The comminuted fracture of the distal fibular diaphysis is again noted with mild medial and posterior displacement of the major distal fracture fragment with respect proximal fragment. The alignment is essentially stable compared to 2 days prior. The ankle mortise appears intact. No new fracture. No appreciable joint effusion. There  are inferior and posterior calcaneal spurs. IMPRESSION: No appreciable change in the alignment of the distal fibular fracture as noted above. No new fracture. The ankle mortise appears intact. Electronically Signed   By: Bretta BangWilliam  Woodruff III M.D.   On: 01/29/2015 13:52   I have personally reviewed and evaluated these images and lab results as part of my medical decision-making.   EKG Interpretation None      MDM   Final diagnoses:  Left fibular fracture, closed, with routine healing, subsequent encounter  Supratherapeutic INR    50 y.o. female here with mechanical fall after walker she was using broke. Landed on L ankle which is fractured and splinted. No head inj/LOC, no other injuries sustained. On coumadin, was on her  way to an appt to check INR. Here INR 3.22, discussed holding coumadin x1 day and call PCP tomorrow to discuss other changes. Xray obtained today reveals distal comminuted fx of fibula which is unchanged. Splint intact, leg NVI, wiggling toes with ease. Doubt need for resplinting since splint is grossly intact. Will have her f/up with Dr. Lajoyce Cornersuda as previously instructed. Script given for walker. I explained the diagnosis and have given explicit precautions to return to the ER including for any other new or worsening symptoms. The patient understands and accepts the medical plan as it's been dictated and I have answered their questions. Discharge instructions concerning home care and prescriptions have been given. The patient is STABLE and is discharged to home in good condition.  BP 138/79 mmHg  Pulse 79  Temp(Src) 98.1 F (36.7 C) (Oral)  Resp 18  SpO2 93%  LMP 01/10/2015  No orders of the defined types were placed in this encounter.       France RavensMercedes Camprubi-Soms, PA-C 01/29/15 1645  Marily MemosJason Mesner, MD 01/30/15 [phone]

## 2015-01-29 NOTE — Discharge Instructions (Signed)
Wear ankle brace and use walker for all weight bearing activities. Ice and elevate ankle throughout the day. Alternate between ibuprofen and home hydrocodone for pain relief. Do not drive or operate machinery with pain medication use. Call orthopedic follow up today or tomorrow to schedule followup appointment for 3-5 days from now for ongoing management of your ankle fracture. Hold your coumadin today and call your regular doctor to discuss further changes to your coumadin since today's INR was 3.22. Return to the ER for changes or worsening symptoms.

## 2015-01-29 NOTE — ED Notes (Signed)
Per family member, states she broke left lower extremity on the 3 rd-states she was ambulating with walker when walker broke-she fell on left left-patient on coumadin-did not hit head-states she just sat down

## 2015-02-05 ENCOUNTER — Other Ambulatory Visit (HOSPITAL_COMMUNITY): Payer: Self-pay | Admitting: Orthopedic Surgery

## 2015-02-05 NOTE — Progress Notes (Signed)
I was unable to reach patient by phone.  I left  A message on voice mail.  I instructed the patient to [facility] at -- 1:50pm  , nothing to eat or drink after midnight.   I instructed the patient to take the following medications in the am with just enough water to get them down:Tegretol, Lamictal; pain medication if toletrates on empty stomach  I asked patient to not wear any lotions, powders, cologne, jewelry, piercing, make-up or nail polish and to not shave . I asked the patient to call [phone], in the am if there were any questions or problems.

## 2015-02-06 ENCOUNTER — Ambulatory Visit (HOSPITAL_COMMUNITY): Payer: Medicare Other | Admitting: Anesthesiology

## 2015-02-06 ENCOUNTER — Encounter (HOSPITAL_COMMUNITY): Payer: Self-pay | Admitting: Certified Registered"

## 2015-02-06 ENCOUNTER — Encounter (HOSPITAL_COMMUNITY)
Admission: RE | Disposition: A | Discharge status: Home / Self Care | Payer: Self-pay | Source: Ambulatory Visit | Attending: Orthopedic Surgery

## 2015-02-06 ENCOUNTER — Ambulatory Visit (HOSPITAL_COMMUNITY): Payer: Medicare Other

## 2015-02-06 ENCOUNTER — Ambulatory Visit (HOSPITAL_COMMUNITY)
Admission: RE | Admit: 2015-02-06 | Discharge: 2015-02-09 | Disposition: A | Discharge status: Home / Self Care | Payer: Medicare Other | Source: Ambulatory Visit | Attending: Orthopedic Surgery | Admitting: Orthopedic Surgery

## 2015-02-06 DIAGNOSIS — Z8719 Personal history of other diseases of the digestive system: Secondary | ICD-10-CM | POA: Diagnosis not present

## 2015-02-06 DIAGNOSIS — Y998 Other external cause status: Secondary | ICD-10-CM | POA: Diagnosis not present

## 2015-02-06 DIAGNOSIS — I272 Other secondary pulmonary hypertension: Secondary | ICD-10-CM | POA: Diagnosis not present

## 2015-02-06 DIAGNOSIS — S82492A Other fracture of shaft of left fibula, initial encounter for closed fracture: Secondary | ICD-10-CM | POA: Insufficient documentation

## 2015-02-06 DIAGNOSIS — W010XXA Fall on same level from slipping, tripping and stumbling without subsequent striking against object, initial encounter: Secondary | ICD-10-CM | POA: Diagnosis not present

## 2015-02-06 DIAGNOSIS — R569 Unspecified convulsions: Secondary | ICD-10-CM | POA: Diagnosis not present

## 2015-02-06 DIAGNOSIS — Z6839 Body mass index (BMI) 39.0-39.9, adult: Secondary | ICD-10-CM | POA: Diagnosis not present

## 2015-02-06 DIAGNOSIS — E785 Hyperlipidemia, unspecified: Secondary | ICD-10-CM | POA: Diagnosis not present

## 2015-02-06 DIAGNOSIS — Y9389 Activity, other specified: Secondary | ICD-10-CM | POA: Insufficient documentation

## 2015-02-06 DIAGNOSIS — Y9289 Other specified places as the place of occurrence of the external cause: Secondary | ICD-10-CM | POA: Diagnosis not present

## 2015-02-06 DIAGNOSIS — Z7901 Long term (current) use of anticoagulants: Secondary | ICD-10-CM | POA: Insufficient documentation

## 2015-02-06 DIAGNOSIS — S82899A Other fracture of unspecified lower leg, initial encounter for closed fracture: Secondary | ICD-10-CM

## 2015-02-06 DIAGNOSIS — Z86711 Personal history of pulmonary embolism: Secondary | ICD-10-CM | POA: Insufficient documentation

## 2015-02-06 DIAGNOSIS — S8263XA Displaced fracture of lateral malleolus of unspecified fibula, initial encounter for closed fracture: Secondary | ICD-10-CM | POA: Diagnosis present

## 2015-02-06 HISTORY — PX: ORIF ANKLE FRACTURE: SHX5408

## 2015-02-06 LAB — COMPREHENSIVE METABOLIC PANEL
ALBUMIN: 3.2 g/dL — AB (ref 3.5–5.0)
ALK PHOS: 78 U/L (ref 38–126)
ALT: 15 U/L (ref 14–54)
AST: 20 U/L (ref 15–41)
Anion gap: 11 (ref 5–15)
BUN: 11 mg/dL (ref 6–20)
CALCIUM: 8.9 mg/dL (ref 8.9–10.3)
CHLORIDE: 106 mmol/L (ref 101–111)
CO2: 24 mmol/L (ref 22–32)
CREATININE: 0.48 mg/dL (ref 0.44–1.00)
GFR calc non Af Amer: >60 mL/min (ref >60)
GLUCOSE: 93 mg/dL (ref 65–99)
Potassium: 4.4 mmol/L (ref 3.5–5.1)
SODIUM: 141 mmol/L (ref 135–145)
Total Bilirubin: 0.6 mg/dL (ref 0.3–1.2)
Total Protein: 6.3 g/dL — ABNORMAL LOW (ref 6.5–8.1)

## 2015-02-06 LAB — CBC
HCT: 39.6 % (ref 36.0–46.0)
HEMOGLOBIN: 12.9 g/dL (ref 12.0–15.0)
MCH: 32.3 pg (ref 26.0–34.0)
MCHC: 32.6 g/dL (ref 30.0–36.0)
MCV: 99.2 fL (ref 78.0–100.0)
PLATELETS: 175 10*3/uL (ref 150–400)
RBC: 3.99 MIL/uL (ref 3.87–5.11)
RDW: 12.7 % (ref 11.5–15.5)
WBC: 6.8 10*3/uL (ref 4.0–10.5)

## 2015-02-06 LAB — SURGICAL PCR SCREEN
MRSA, PCR: NEGATIVE
Staphylococcus aureus: POSITIVE — AB

## 2015-02-06 LAB — HCG, SERUM, QUALITATIVE: PREG SERUM: NEGATIVE

## 2015-02-06 LAB — PROTIME-INR
INR: 1.23 (ref 0.00–1.49)
Prothrombin Time: 15.7 seconds — ABNORMAL HIGH (ref 11.6–15.2)

## 2015-02-06 LAB — APTT: APTT: 27 s (ref 24–37)

## 2015-02-06 SURGERY — OPEN REDUCTION INTERNAL FIXATION (ORIF) ANKLE FRACTURE
Anesthesia: Regional | Laterality: Left

## 2015-02-06 MED ORDER — FENTANYL CITRATE (PF) 100 MCG/2ML IJ SOLN
INTRAMUSCULAR | Status: AC
  Filled 2015-02-06: qty 2

## 2015-02-06 MED ORDER — CARBAMAZEPINE 100 MG/5ML PO SUSP: 100.0000 mg | Freq: Two times a day (BID) | ORAL | Status: DC

## 2015-02-06 MED ORDER — METOCLOPRAMIDE HCL 5 MG PO TABS: 5.0000 mg | ORAL_TABLET | Freq: Three times a day (TID) | ORAL | Status: DC | PRN

## 2015-02-06 MED ORDER — PROPOFOL 10 MG/ML IV BOLUS
INTRAVENOUS | Status: AC
  Filled 2015-02-06: qty 20

## 2015-02-06 MED ORDER — ACETAMINOPHEN 325 MG PO TABS
650.0000 mg | ORAL_TABLET | Freq: Four times a day (QID) | ORAL | Status: DC | PRN
  Administered 2015-02-06 – 2015-02-09 (×5): 650 mg via ORAL
  Filled 2015-02-06 (×5): qty 2

## 2015-02-06 MED ORDER — MIDAZOLAM HCL 2 MG/2ML IJ SOLN
1.0000 mg | Freq: Once | INTRAMUSCULAR | Status: AC
  Administered 2015-02-06: 1 mg via INTRAVENOUS

## 2015-02-06 MED ORDER — PROMETHAZINE HCL 25 MG/ML IJ SOLN: 6.2500 mg | INTRAMUSCULAR | Status: DC | PRN

## 2015-02-06 MED ORDER — ONDANSETRON HCL 4 MG/2ML IJ SOLN
INTRAMUSCULAR | Status: DC | PRN
  Administered 2015-02-06: 4 mg via INTRAVENOUS

## 2015-02-06 MED ORDER — FENTANYL CITRATE (PF) 250 MCG/5ML IJ SOLN
INTRAMUSCULAR | Status: AC
  Filled 2015-02-06: qty 5

## 2015-02-06 MED ORDER — PANTOPRAZOLE SODIUM 40 MG PO TBEC
40.0000 mg | DELAYED_RELEASE_TABLET | Freq: Every day | ORAL | Status: DC
  Administered 2015-02-06 – 2015-02-09 (×4): 40 mg via ORAL
  Filled 2015-02-06 (×4): qty 1

## 2015-02-06 MED ORDER — METHOCARBAMOL 1000 MG/10ML IJ SOLN: 500.0000 mg | Freq: Four times a day (QID) | INTRAVENOUS | Status: DC | PRN

## 2015-02-06 MED ORDER — PHENYLEPHRINE 40 MCG/ML (10ML) SYRINGE FOR IV PUSH (FOR BLOOD PRESSURE SUPPORT)
PREFILLED_SYRINGE | INTRAVENOUS | Status: AC
  Filled 2015-02-06: qty 10

## 2015-02-06 MED ORDER — CEFAZOLIN SODIUM-DEXTROSE 2-3 GM-% IV SOLR
2.0000 g | Freq: Four times a day (QID) | INTRAVENOUS | Status: AC
  Administered 2015-02-06 – 2015-02-07 (×3): 2 g via INTRAVENOUS
  Filled 2015-02-06 (×3): qty 50

## 2015-02-06 MED ORDER — HYDROMORPHONE HCL 1 MG/ML IJ SOLN: 0.2500 mg | INTRAMUSCULAR | Status: DC | PRN

## 2015-02-06 MED ORDER — SODIUM CHLORIDE 0.9 % IV SOLN: INTRAVENOUS | Status: DC

## 2015-02-06 MED ORDER — HYDROMORPHONE HCL 1 MG/ML IJ SOLN
1.0000 mg | INTRAMUSCULAR | Status: DC | PRN
  Administered 2015-02-08: 1 mg via INTRAVENOUS
  Filled 2015-02-06: qty 1

## 2015-02-06 MED ORDER — CARBAMAZEPINE 200 MG PO TABS
200.0000 mg | ORAL_TABLET | Freq: Four times a day (QID) | ORAL | Status: DC
  Administered 2015-02-06 – 2015-02-07 (×4): 200 mg via ORAL
  Filled 2015-02-06 (×5): qty 1

## 2015-02-06 MED ORDER — METHOCARBAMOL 500 MG PO TABS
500.0000 mg | ORAL_TABLET | Freq: Four times a day (QID) | ORAL | Status: DC | PRN
  Administered 2015-02-07 – 2015-02-09 (×6): 500 mg via ORAL
  Filled 2015-02-06 (×6): qty 1

## 2015-02-06 MED ORDER — MIDAZOLAM HCL 2 MG/2ML IJ SOLN
INTRAMUSCULAR | Status: AC
  Filled 2015-02-06: qty 2

## 2015-02-06 MED ORDER — BUPIVACAINE-EPINEPHRINE (PF) 0.5% -1:200000 IJ SOLN
INTRAMUSCULAR | Status: DC | PRN
  Administered 2015-02-06: 40 mL via PERINEURAL

## 2015-02-06 MED ORDER — NITROFURANTOIN MACROCRYSTAL 50 MG PO CAPS
50.0000 mg | ORAL_CAPSULE | Freq: Every day | ORAL | Status: DC
  Administered 2015-02-06 – 2015-02-08 (×3): 50 mg via ORAL
  Filled 2015-02-06 (×4): qty 1

## 2015-02-06 MED ORDER — FENTANYL CITRATE (PF) 100 MCG/2ML IJ SOLN
50.0000 ug | Freq: Once | INTRAMUSCULAR | Status: AC
  Administered 2015-02-06: 50 ug via INTRAVENOUS

## 2015-02-06 MED ORDER — METOCLOPRAMIDE HCL 5 MG/ML IJ SOLN
5.0000 mg | Freq: Three times a day (TID) | INTRAMUSCULAR | Status: DC | PRN
  Administered 2015-02-07: 10 mg via INTRAVENOUS
  Filled 2015-02-06: qty 2

## 2015-02-06 MED ORDER — 0.9 % SODIUM CHLORIDE (POUR BTL) OPTIME
TOPICAL | Status: DC | PRN
  Administered 2015-02-06: 1000 mL

## 2015-02-06 MED ORDER — LIDOCAINE HCL (CARDIAC) 20 MG/ML IV SOLN
INTRAVENOUS | Status: AC
  Filled 2015-02-06: qty 5

## 2015-02-06 MED ORDER — ONDANSETRON HCL 4 MG PO TABS: 4.0000 mg | ORAL_TABLET | Freq: Four times a day (QID) | ORAL | Status: DC | PRN

## 2015-02-06 MED ORDER — PHENYLEPHRINE HCL 10 MG/ML IJ SOLN
INTRAMUSCULAR | Status: DC | PRN
  Administered 2015-02-06: 80 ug via INTRAVENOUS
  Administered 2015-02-06 (×2): 160 ug via INTRAVENOUS

## 2015-02-06 MED ORDER — HYDROCODONE-ACETAMINOPHEN 5-325 MG PO TABS
1.0000 | ORAL_TABLET | ORAL | Status: DC | PRN
  Administered 2015-02-07 – 2015-02-09 (×10): 2 via ORAL
  Filled 2015-02-06 (×10): qty 2

## 2015-02-06 MED ORDER — LAMOTRIGINE 150 MG PO TABS
150.0000 mg | ORAL_TABLET | Freq: Every day | ORAL | Status: DC
  Administered 2015-02-07 – 2015-02-09 (×3): 150 mg via ORAL
  Filled 2015-02-06: qty 1.5
  Filled 2015-02-06: qty 1
  Filled 2015-02-06: qty 2
  Filled 2015-02-06 (×2): qty 1

## 2015-02-06 MED ORDER — ONDANSETRON HCL 4 MG/2ML IJ SOLN
4.0000 mg | Freq: Four times a day (QID) | INTRAMUSCULAR | Status: DC | PRN
  Administered 2015-02-07: 4 mg via INTRAVENOUS
  Filled 2015-02-06: qty 2

## 2015-02-06 MED ORDER — ONDANSETRON HCL 4 MG/2ML IJ SOLN
INTRAMUSCULAR | Status: AC
  Filled 2015-02-06: qty 2

## 2015-02-06 MED ORDER — WARFARIN SODIUM 5 MG PO TABS: 5.0000 mg | ORAL_TABLET | ORAL | Status: DC

## 2015-02-06 MED ORDER — ACETAMINOPHEN 650 MG RE SUPP: 650.0000 mg | Freq: Four times a day (QID) | RECTAL | Status: DC | PRN

## 2015-02-06 MED ORDER — CHLORHEXIDINE GLUCONATE 4 % EX LIQD: 60.0000 mL | Freq: Once | CUTANEOUS | Status: DC

## 2015-02-06 MED ORDER — LACTATED RINGERS IV SOLN
INTRAVENOUS | Status: DC
  Administered 2015-02-06: 50 mL/h via INTRAVENOUS

## 2015-02-06 MED ORDER — WARFARIN - PHYSICIAN DOSING INPATIENT
Freq: Every day | Status: DC
  Administered 2015-02-08: 16:00:00

## 2015-02-06 MED ORDER — WARFARIN SODIUM 5 MG PO TABS
10.0000 mg | ORAL_TABLET | ORAL | Status: DC
  Administered 2015-02-08 – 2015-02-09 (×2): 10 mg via ORAL
  Filled 2015-02-06 (×2): qty 2

## 2015-02-06 MED ORDER — PROPOFOL 10 MG/ML IV BOLUS
INTRAVENOUS | Status: DC | PRN
  Administered 2015-02-06: 170 mg via INTRAVENOUS

## 2015-02-06 MED ORDER — EPHEDRINE SULFATE 50 MG/ML IJ SOLN
INTRAMUSCULAR | Status: DC | PRN
  Administered 2015-02-06: 15 mg via INTRAVENOUS
  Administered 2015-02-06: 10 mg via INTRAVENOUS

## 2015-02-06 MED ORDER — POTASSIUM CHLORIDE CRYS ER 20 MEQ PO TBCR
20.0000 meq | EXTENDED_RELEASE_TABLET | Freq: Every day | ORAL | Status: DC
  Administered 2015-02-06 – 2015-02-09 (×4): 20 meq via ORAL
  Filled 2015-02-06 (×4): qty 1

## 2015-02-06 MED ORDER — DEXTROSE 5 % IV SOLN
3.0000 g | INTRAVENOUS | Status: AC
  Administered 2015-02-06: 3 g via INTRAVENOUS
  Filled 2015-02-06: qty 3000

## 2015-02-06 SURGICAL SUPPLY — 48 items
BANDAGE ESMARK 6X9 LF (GAUZE/BANDAGES/DRESSINGS) IMPLANT
BIT DRILL 2.5X110 QC LCP DISP (BIT) ×2 IMPLANT
BNDG COHESIVE 4X5 TAN STRL (GAUZE/BANDAGES/DRESSINGS) ×2 IMPLANT
BNDG COHESIVE 6X5 TAN STRL LF (GAUZE/BANDAGES/DRESSINGS) ×2 IMPLANT
BNDG ESMARK 6X9 LF (GAUZE/BANDAGES/DRESSINGS)
BNDG GAUZE ELAST 4 BULKY (GAUZE/BANDAGES/DRESSINGS) ×2 IMPLANT
COVER SURGICAL LIGHT HANDLE (MISCELLANEOUS) ×4 IMPLANT
CUFF TOURNIQUET SINGLE 34IN LL (TOURNIQUET CUFF) IMPLANT
CUFF TOURNIQUET SINGLE 44IN (TOURNIQUET CUFF) IMPLANT
DRAPE INCISE IOBAN 66X45 STRL (DRAPES) ×2 IMPLANT
DRAPE OEC MINIVIEW 54X84 (DRAPES) ×2 IMPLANT
DRAPE PROXIMA HALF (DRAPES) ×2 IMPLANT
DRAPE U-SHAPE 47X51 STRL (DRAPES) ×2 IMPLANT
DRSG ADAPTIC 3X8 NADH LF (GAUZE/BANDAGES/DRESSINGS) ×2 IMPLANT
DRSG PAD ABDOMINAL 8X10 ST (GAUZE/BANDAGES/DRESSINGS) ×4 IMPLANT
DURAPREP 26ML APPLICATOR (WOUND CARE) ×2 IMPLANT
ELECT REM PT RETURN 9FT ADLT (ELECTROSURGICAL) ×2
ELECTRODE REM PT RTRN 9FT ADLT (ELECTROSURGICAL) ×1 IMPLANT
GAUZE SPONGE 4X4 12PLY STRL (GAUZE/BANDAGES/DRESSINGS) ×2 IMPLANT
GLOVE BIOGEL PI IND STRL 9 (GLOVE) ×1 IMPLANT
GLOVE BIOGEL PI INDICATOR 9 (GLOVE) ×1
GLOVE SURG ORTHO 9.0 STRL STRW (GLOVE) ×2 IMPLANT
GLOVE SURG SS PI 6.5 STRL IVOR (GLOVE) ×4 IMPLANT
GOWN STRL REUS W/ TWL XL LVL3 (GOWN DISPOSABLE) ×3 IMPLANT
GOWN STRL REUS W/TWL XL LVL3 (GOWN DISPOSABLE) ×3
KIT BASIN OR (CUSTOM PROCEDURE TRAY) ×2 IMPLANT
KIT ROOM TURNOVER OR (KITS) ×2 IMPLANT
MANIFOLD NEPTUNE II (INSTRUMENTS) IMPLANT
NS IRRIG 1000ML POUR BTL (IV SOLUTION) ×2 IMPLANT
PACK ORTHO EXTREMITY (CUSTOM PROCEDURE TRAY) ×2 IMPLANT
PAD ARMBOARD 7.5X6 YLW CONV (MISCELLANEOUS) ×4 IMPLANT
PLATE LCP 3.5 1/3 TUB 8HX93 (Plate) ×2 IMPLANT
SCREW CORTEX 3.5 12MM (Screw) ×3 IMPLANT
SCREW CORTEX 3.5 20MM (Screw) ×2 IMPLANT
SCREW LOCK CORT ST 3.5X12 (Screw) ×3 IMPLANT
SCREW LOCK CORT ST 3.5X20 (Screw) ×2 IMPLANT
SCREW LOCK T15 FT 14X3.5X2.9X (Screw) ×1 IMPLANT
SCREW LOCK T15 FT 16X3.5X2.9X (Screw) ×1 IMPLANT
SCREW LOCKING 3.5X14 (Screw) ×1 IMPLANT
SCREW LOCKING 3.5X16 (Screw) ×1 IMPLANT
SPONGE LAP 18X18 X RAY DECT (DISPOSABLE) ×4 IMPLANT
STAPLER VISISTAT 35W (STAPLE) IMPLANT
SUCTION FRAZIER TIP 10 FR DISP (SUCTIONS) ×2 IMPLANT
SUT ETHILON 2 0 PSLX (SUTURE) IMPLANT
SUT VIC AB 2-0 CTB1 (SUTURE) ×4 IMPLANT
TOWEL OR 17X24 6PK STRL BLUE (TOWEL DISPOSABLE) ×2 IMPLANT
TOWEL OR 17X26 10 PK STRL BLUE (TOWEL DISPOSABLE) ×2 IMPLANT
TUBE CONNECTING 12X1/4 (SUCTIONS) ×2 IMPLANT

## 2015-02-06 NOTE — Progress Notes (Signed)
Orthopedic Tech Progress Note Patient Details:  Alric QuanMichelle A Peek Jun 01, 1965 [id number] Patient already has boot Patient ID: Alric QuanMichelle A Rivest, female   DOB: Jun 01, 1965, 50 y.o.   MRN: [id number]   Jennye MoccasinHughes, Zavia Pullen Craig 02/06/2015, 10:29 PM

## 2015-02-06 NOTE — Transfer of Care (Signed)
Immediate Anesthesia Transfer of Care Note  Patient: Amber QuanMichelle A Warner  Procedure(s) Performed: Procedure(s): OPEN REDUCTION INTERNAL FIXATION (ORIF) ANKLE FRACTURE (Left)  Patient Location: PACU  Anesthesia Type:General  Level of Consciousness: awake, alert  and oriented  Airway & Oxygen Therapy: Patient Spontanous Breathing and Patient connected to nasal cannula oxygen  Post-op Assessment: Report given to RN  Post vital signs: Reviewed and stable  Last Vitals:  Filed Vitals:   02/06/15 1510 02/06/15 1515  BP: 102/46 119/56  Pulse: 70 80  Temp:    Resp: 13 19    Complications: No apparent anesthesia complications

## 2015-02-06 NOTE — Anesthesia Preprocedure Evaluation (Addendum)
Anesthesia Evaluation  Patient identified by MRN, date of birth, ID band Patient awake    Reviewed: Allergy & Precautions, NPO status , Patient's Chart, lab work & pertinent test results  Airway Mallampati: II  TM Distance: >3 FB Neck ROM: Full    Dental  (+) Dental Advisory Given, Poor Dentition, Missing   Pulmonary PE   Pulmonary exam normal breath sounds clear to auscultation       Cardiovascular (-) hypertension(-) angina(-) CAD and (-) Past MI Normal cardiovascular exam Rhythm:Regular Rate:Normal  Echo 2015: Study Conclusions  - Left ventricle: The cavity size was normal. Systolic function was normal. The estimated ejection fraction was in the range of 50% to 55%. Wall motion was normal; therewere no regional wall motion abnormalities. There was anincreased relative contribution of atrial contraction toventricular filling. Doppler parameters are consistent with abnormal left ventricular relaxation (grade 1 diastolic dysfunction). Mild to moderate concentric left ventricular hypertrophy. - Ventricular septum: The contour showed systolic flattening. These changes are consistent with RV pressureoverload. - Aorta: Mild aortic root dilatation (4 cm). - Right ventricle: The cavity size was moderately dilated. Systolic function was moderately reduced. - Right atrium: The atrium was mildly dilated. - Tricuspid valve: Mildly dilated annulus. Normal leaflet thickness. Moderate regurgitation. - Pulmonary arteries: PA peak pressure: 80mm Hg (S). Severely elevated pulmonary pressures. - Inferior vena cava: The vessel was dilated; the respirophasic diameter changes were blunted (< 50%); findings are consistent with elevated central venous pressure. - Pericardium, extracardiac: A trivial pericardial effusion was identified.    Neuro/Psych Seizures -, Poorly Controlled,     GI/Hepatic negative GI ROS,  Neg liver ROS,   Endo/Other  Morbid obesity  Renal/GU negative Renal ROS     Musculoskeletal Left ankle fracture   Abdominal   Peds  Hematology  (+) Blood dyscrasia (on coumadin therapy for PE. INR 1.23), ,   Anesthesia Other Findings Day of surgery medications reviewed with the patient.  Reproductive/Obstetrics                           Anesthesia Physical Anesthesia Plan  ASA: III  Anesthesia Plan: Regional and General   Post-op Pain Management: MAC Combined w/ Regional for Post-op pain and GA combined w/ Regional for post-op pain   Induction: Intravenous  Airway Management Planned: LMA  Additional Equipment:   Intra-op Plan:   Post-operative Plan: Extubation in OR  Informed Consent: I have reviewed the patients History and Physical, chart, labs and discussed the procedure including the risks, benefits and alternatives for the proposed anesthesia with the patient or authorized representative who has indicated his/her understanding and acceptance.   Dental advisory given  Plan Discussed with: CRNA  Anesthesia Plan Comments: (Risks/benefits of general anesthesia discussed with patient including risk of damage to teeth, lips, gum, and tongue, nausea/vomiting, allergic reactions to medications, and the possibility of heart attack, stroke and death.  All patient questions answered.  Patient wishes to proceed.  Regional + GA)       Anesthesia Quick Evaluation

## 2015-02-06 NOTE — Anesthesia Procedure Notes (Addendum)
Anesthesia Regional Block:  Adductor canal block  Pre-Anesthetic Checklist: ,, timeout performed, Correct Patient, Correct Site, Correct Laterality, Correct Procedure, Correct Position, site marked, Risks and benefits discussed,  Surgical consent,  Pre-op evaluation,  At surgeon's request and post-op pain management  Laterality: Left  Prep: chloraprep       Needles:  Injection technique: Single-shot  Needle Type: Echogenic Needle     Needle Length: 9cm 9 cm Needle Gauge: 21 and 21 G    Additional Needles:  Procedures: ultrasound guided (picture in chart) Adductor canal block Narrative:  Injection made incrementally with aspirations every 5 mL.  Performed by: Personally  Anesthesiologist: Cecile HearingURK, STEPHEN EDWARD  Additional Notes: No pain on injection. No increased resistance to injection. Injection made in 5cc increments.  Good needle visualization.  Patient tolerated procedure well.   Anesthesia Regional Block:  Popliteal block  Pre-Anesthetic Checklist: ,, timeout performed, Correct Patient, Correct Site, Correct Laterality, Correct Procedure, Correct Position, site marked, Risks and benefits discussed,  Surgical consent,  Pre-op evaluation,  At surgeon's request and post-op pain management  Laterality: Left  Prep: chloraprep       Needles:  Injection technique: Single-shot  Needle Type: Echogenic Needle     Needle Length: 9cm 9 cm Needle Gauge: 21 and 21 G    Additional Needles:  Procedures: ultrasound guided (picture in chart) Popliteal block Narrative:  Injection made incrementally with aspirations every 5 mL.  Performed by: Personally  Anesthesiologist: Cecile HearingURK, STEPHEN EDWARD  Additional Notes: No pain on injection. No increased resistance to injection. Injection made in 5cc increments.  Good needle visualization.  Patient tolerated procedure well.   Procedure Name: LMA Insertion Date/Time: 02/06/2015 5:43 PM Performed by: Jefm MilesENNIE, Bayron Dalto  E Pre-anesthesia Checklist: Patient identified, Emergency Drugs available, Suction available, Timeout performed and Patient being monitored Patient Re-evaluated:Patient Re-evaluated prior to inductionOxygen Delivery Method: Circle system utilized Preoxygenation: Pre-oxygenation with 100% oxygen Intubation Type: IV induction Ventilation: Mask ventilation without difficulty LMA: LMA inserted LMA Size: 4.0 Number of attempts: 1 Placement Confirmation: positive ETCO2 and breath sounds checked- equal and bilateral Tube secured with: Tape Dental Injury: Teeth and Oropharynx as per pre-operative assessment

## 2015-02-06 NOTE — H&P (Signed)
Amber Warner is an 50 y.o. female.   Chief Complaint: Left ankle fracture HPI: Patient is a 50 year old woman whose slipped sustaining a ground level fall with a  fibular fracture of the left ankle  Past Medical History  Diagnosis Date  . Seizures (HCC)   . Pulmonary hypertension (HCC) 05/22/2013  . Bilateral pulmonary embolism (HCC) 05/21/2013  . 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 Hyperlipidemia 05/25/2013  . 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 Hemorrhoid surgery      Family History  Problem Relation Age of Onset  . Hypertension Mother   . Diabetes Mellitus II Maternal Aunt   . Stroke Father   . Heart attack Father [phone]    died at age 50   Social History:  reports that she has never smoked. She has never used smokeless tobacco. She reports that she does not drink alcohol or use illicit drugs.  Allergies: No Known Allergies  No prescriptions prior to admission    No results found for this or any previous visit (from the past 48 hour(s)). No results found.  Review of Systems  All other systems reviewed and are negative.   Last menstrual period 01/10/2015. Physical Exam  Objective examination patient is a good dorsalis pedis pulse. Radiographs shows a fracture of the distal fibula which is a little proximal to a classic Weber B fibular fracture. There is no significant widening of the mortise. The skin is intact. Assessment/Plan Assessment: Left distal fibular fracture. Just proximal to a typical Weber B fibular fracture.  Plan: We'll plan for open reduction internal fixation patient may require a syndesmotic fixation. Risks and benefits of surgery was discussed including infection neurovascular injury arthritis need for additional surgery. Patient states she understands wishes to proceed at this time.  DUDA,MARCUS V 02/06/2015, 6:27 AM

## 2015-02-06 NOTE — Op Note (Signed)
02/06/2015  6:10 PM  PATIENT:  Amber Warner    PRE-OPERATIVE DIAGNOSIS:  Left Ankle Fracture Weber B fibular fracture  POST-OPERATIVE DIAGNOSIS:  Same  PROCEDURE:  OPEN REDUCTION INTERNAL FIXATION (ORIF) ANKLE FRACTURE  SURGEON:  Nadara MustardUDA,Stokes Rattigan V, MD  PHYSICIAN ASSISTANT:None ANESTHESIA:   General  PREOPERATIVE INDICATIONS:  Amber Warner is a  50 y.o. female with a diagnosis of Left Ankle Fracture who failed conservative measures and elected for surgical management.    The risks benefits and alternatives were discussed with the patient preoperatively including but not limited to the risks of infection, bleeding, nerve injury, cardiopulmonary complications, the need for revision surgery, among others, and the patient was willing to proceed.  OPERATIVE IMPLANTS: 8 hole Synthes locking plate  OPERATIVE FINDINGS: Long oblique Weber B fracture  OPERATIVE PROCEDURE: Patient brought to the operating room and underwent a general anesthetic. After adequate levels anesthesia obtained patient's left lower extremity was prepped using DuraPrep draped into a sterile field. A timeout was called. A lateral incision was made over the fibula this was carried sharply down to bone subperiosteal dissection was performed and the fracture was freshened and irrigated. The fracture was reduced and held stable with a lag screw. An anti-glide plate was then placed posterior laterally and secured proximally with 3 compression screws and distally with 2 locking screws. The wound was irrigated with normal saline C-arm floss be verified alignment with a congruent mortise. The subcutaneous is closed using 2-0 Vicryl skin was closed using 2-0 nylon a sterile compressive dressing was applied patient was placed back in her fracture boot plan for overnight observation. Patient has prescriptions for pain medicine at home.

## 2015-02-07 DIAGNOSIS — S82492A Other fracture of shaft of left fibula, initial encounter for closed fracture: Secondary | ICD-10-CM | POA: Diagnosis not present

## 2015-02-07 LAB — PROTIME-INR
INR: 1.23 (ref 0.00–1.49)
PROTHROMBIN TIME: 15.7 s — AB (ref 11.6–15.2)

## 2015-02-07 MED ORDER — CARBAMAZEPINE 200 MG PO TABS
200.0000 mg | ORAL_TABLET | Freq: Four times a day (QID) | ORAL | Status: DC
  Administered 2015-02-07 – 2015-02-09 (×8): 200 mg via ORAL
  Filled 2015-02-07 (×4): qty 1

## 2015-02-07 MED ORDER — CARBAMAZEPINE 100 MG/5ML PO SUSP
100.0000 mg | ORAL | Status: DC
  Administered 2015-02-07 – 2015-02-09 (×6): 100 mg via ORAL
  Filled 2015-02-07 (×7): qty 5

## 2015-02-07 NOTE — Progress Notes (Signed)
Subjective: Patient stable block now just wearing off   Objective: Vital signs in last 24 hours: Temp:  [97.8 F (36.6 C)-98.3 F (36.8 C)] 98.3 F (36.8 C) (01/14 0435) Pulse Rate:  [70-98] 93 (01/14 0435) Resp:  [10-23] 18 (01/14 0435) BP: (93-167)/(42-73) 129/51 mmHg (01/14 0435) SpO2:  [91 %-100 %] 91 % (01/14 0435) Weight:  [108.863 kg (240 lb)] 108.863 kg (240 lb) (01/13 1155)  Intake/Output from previous day: 01/13 0701 - 01/14 0700 In: 700 [I.V.:700] Out: -  Intake/Output this shift: Total I/O In: 240 [P.O.:240] Out: -   Exam:  Compartment soft  Labs:  Recent Labs  02/06/15 1210  HGB 12.9    Recent Labs  02/06/15 1210  WBC 6.8  RBC 3.99  HCT 39.6  PLT 175    Recent Labs  02/06/15 1210  NA 141  K 4.4  CL 106  CO2 24  BUN 11  CREATININE 0.48  GLUCOSE 93  CALCIUM 8.9    Recent Labs  02/06/15 1210 02/07/15 0555  INR 1.23 1.23    Assessment/Plan: Plan discharge today elevate leg follow-up with Dr. Lajoyce Cornersuda next week   DEAN,GREGORY SCOTT 02/07/2015, 10:15 AM

## 2015-02-07 NOTE — Progress Notes (Signed)
Physical Therapy Treatment Patient Details Name: Amber QuanMichelle A Warner MRN: [id number] DOB: January 29, 1965 Today's Date: 02/07/2015    History of Present Illness Patient is a 50 y/o female with hx of seizures, pulmonary HTN, bil PEs, HLD s/p ORIF left ankle due to fall.    PT Comments    Patient's nausea improved but still complains of headache and pain in LLE. Requires max encouragement from this therapist and mother to perform transfer. Min A for squat pivot transfer to w/c- performed family training with mother. Encouraged OOB as much as tolerated. Pt needs elevating leg rests for w/c and 2 wheeled walker for gait training. Will follow acutely.   Follow Up Recommendations  Home health PT;Supervision/Assistance - 24 hour     Equipment Recommendations  Rolling walker with 5" wheels; elevating leg rest for w/c   Recommendations for Other Services OT consult     Precautions / Restrictions Precautions Precautions: Fall Required Braces or Orthoses: Other Brace/Splint Other Brace/Splint: CAM boot Restrictions Weight Bearing Restrictions: Yes LLE Weight Bearing: Touchdown weight bearing    Mobility  Bed Mobility Overal bed mobility: Needs Assistance Bed Mobility: Supine to Sit     Supine to sit: HOB elevated;Min assist Sit to supine: Min assist;HOB elevated   General bed mobility comments: Assist to bring LLE to EOB.   Transfers Overall transfer level: Needs assistance   Transfers: Squat Pivot Transfers     Squat pivot transfers: Min assist     General transfer comment: Min A for transfer to w/c with cues for technique and hand placement. Placing some weight through LLE- difficult to tell how much.  Ambulation/Gait                 Stairs            Wheelchair Mobility    Modified Rankin (Stroke Patients Only)       Balance Overall balance assessment: Needs assistance Sitting-balance support: Feet supported;No upper extremity supported Sitting  balance-Leahy Scale: Fair Sitting balance - Comments: Able to sit with UE support. + nausea   Standing balance support: During functional activity Standing balance-Leahy Scale: Poor                      Cognition Arousal/Alertness: Awake/alert Behavior During Therapy: WFL for tasks assessed/performed Overall Cognitive Status: Within Functional Limits for tasks assessed                      Exercises      General Comments General comments (skin integrity, edema, etc.): Mother present for session. Discussed transfer technique with mother who has been helping her at home.       Pertinent Vitals/Pain Pain Assessment: Faces Pain Score: 10-Worst pain ever Faces Pain Scale: Hurts even more Pain Location: headache; LLE Pain Descriptors / Indicators: Headache;Throbbing Pain Intervention(s): Monitored during session;Repositioned;Premedicated before session;Limited activity within patient's tolerance    Home Living Family/patient expects to be discharged to:: Private residence Living Arrangements: Parent Available Help at Discharge: Family Type of Home: House Home Access: Stairs to enter Entrance Stairs-Rails: None Home Layout: One level Home Equipment: Bedside commode;Wheelchair - manual;Shower seat Additional Comments: rollator broke PTA.    Prior Function Level of Independence: Needs assistance  Gait / Transfers Assistance Needed: Supervision/assist for ambulation with RW for short distances as pt has epilepsy and has seizures frequently. Needed rollator so mom could sit pt down during a seizure. ADL's / Homemaking Assistance Needed: Assist with ADLs.- sponge  bathes     PT Goals (current goals can now be found in the care plan section) Acute Rehab PT Goals Patient Stated Goal: to feel better PT Goal Formulation: With patient/family Time For Goal Achievement: 02/21/15 Potential to Achieve Goals: Fair Progress towards PT goals: Progressing toward goals     Frequency  Min 3X/week    PT Plan Current plan remains appropriate    Co-evaluation             End of Session Equipment Utilized During Treatment: Gait belt Activity Tolerance: Patient tolerated treatment well Patient left: in chair;with call bell/phone within reach;with family/visitor present (personal w/c)     Time: 1610-9604 PT Time Calculation (min) (ACUTE ONLY): 20 min  Charges:  $Therapeutic Activity: 8-22 mins                    G Codes:      Amber Warner 02/07/2015, 4:25 PM Mylo Red, PT, DPT [phone]

## 2015-02-07 NOTE — Evaluation (Signed)
Physical Therapy Evaluation Patient Details Name: Amber Warner MRN: [id number] DOB: 1965/10/17 Today's Date: 02/07/2015   History of Present Illness  Patient is a 50 y/o female with hx of seizures, pulmonary HTN, bil PEs, HLD s/p ORIF left ankle due to fall.  Clinical Impression  Patient presents with nausea, dizziness and pain limiting mobility assessment today. Pt requires Min A to manage LLE. Not able to perform transfers due to above. Pt has 24/7 S from mother at home. Pt does need RW as pt has rollator already ordered in [place] but will not be able to use that due to TDWB status LLE. Will need 2 wheeled walker. Pt is not safe to discharge home today due to safety concerns. Recommend another night so pt can improve mobility prior to return home. Will follow acutely.     Follow Up Recommendations Home health PT;Supervision/Assistance - 24 hour    Equipment Recommendations  Rolling walker with 5" wheels    Recommendations for Other Services OT consult     Precautions / Restrictions Precautions Precautions: Fall Required Braces or Orthoses: Other Brace/Splint Other Brace/Splint: CAM boot Restrictions Weight Bearing Restrictions: Yes LLE Weight Bearing: Touchdown weight bearing      Mobility  Bed Mobility Overal bed mobility: Needs Assistance Bed Mobility: Supine to Sit;Sit to Supine     Supine to sit: Min guard;HOB elevated Sit to supine: Min assist;HOB elevated   General bed mobility comments: Assist to bring LLE into bed. + dizziness, nausea. Needed to return to supine due to nausea/pain. Pt crying.   Transfers Overall transfer level:  (Deferred secondary to intense pain, nausea)                  Ambulation/Gait                Stairs            Wheelchair Mobility    Modified Rankin (Stroke Patients Only)       Balance Overall balance assessment: Needs assistance Sitting-balance support: Feet supported;Bilateral upper extremity  supported Sitting balance-Leahy Scale: Fair Sitting balance - Comments: Able to sit with UE support. + nausea                                     Pertinent Vitals/Pain Pain Assessment: 0-10 Pain Score: 10-Worst pain ever Pain Location: LLE Pain Descriptors / Indicators: Sore;Throbbing Pain Intervention(s): Monitored during session;Repositioned;Limited activity within patient's tolerance    Home Living Family/patient expects to be discharged to:: Private residence Living Arrangements: Parent Available Help at Discharge: Family Type of Home: House Home Access: Stairs to enter Entrance Stairs-Rails: None Entrance Stairs-Number of Steps: 1 Home Layout: One level Home Equipment: Bedside commode;Wheelchair - manual;Shower seat Additional Comments: rollator broke PTA.    Prior Function Level of Independence: Needs assistance   Gait / Transfers Assistance Needed: Supervision/assist for ambulation with RW for short distances as pt has epilepsy and has seizures frequently. Needed rollator so mom could sit pt down during a seizure.  ADL's / Homemaking Assistance Needed: Assist with ADLs.- sponge bathes        Hand Dominance   Dominant Hand: Right    Extremity/Trunk Assessment   Upper Extremity Assessment: Defer to OT evaluation           Lower Extremity Assessment: LLE deficits/detail;Generalized weakness   LLE Deficits / Details: CAM boot donned; able to perform LAQ with  pain.      Communication   Communication: No difficulties  Cognition Arousal/Alertness: Awake/alert Behavior During Therapy: WFL for tasks assessed/performed Overall Cognitive Status: Within Functional Limits for tasks assessed                      General Comments      Exercises        Assessment/Plan    PT Assessment Patient needs continued PT services  PT Diagnosis Acute pain   PT Problem List Decreased strength;Pain;Impaired sensation;Decreased balance;Decreased  mobility;Decreased activity tolerance  PT Treatment Interventions Balance training;Functional mobility training;Therapeutic activities;Therapeutic exercise;Wheelchair mobility training;Patient/family education;Gait training   PT Goals (Current goals can be found in the Care Plan section) Acute Rehab PT Goals Patient Stated Goal: to feel better PT Goal Formulation: With patient/family Time For Goal Achievement: 02/21/15 Potential to Achieve Goals: Fair    Frequency Min 3X/week   Barriers to discharge Inaccessible home environment 1 step to enter home    Co-evaluation               End of Session Equipment Utilized During Treatment: Gait belt Activity Tolerance: Other (comment);Patient limited by pain (nausea) Patient left: in bed;with call bell/phone within reach;with family/visitor present Nurse Communication: Mobility status;Other (comment) (requests nausea medication)         Time: [id number] PT Time Calculation (min) (ACUTE ONLY): 24 min   Charges:   PT Evaluation $PT Eval Moderate Complexity: 1 Procedure     PT G Codes:        Alyce Inscore A Tonianne Fine 02/07/2015, 3:13 PM Mylo RedShauna Jowanda Heeg, PT, DPT [phone]

## 2015-02-07 NOTE — Progress Notes (Signed)
Patient had severe headache witch is not relieved by pain meds. Patient's mom thinks the headache is due to the generic Tegretol that [facility]. Per patient's mom, patient had similar episode in the past  hospitalization. Mom requested taking their own brand meds from home. I paged Dr. August Saucerean, he gave the permission to allow patient to take her own Tegretol  at hospital. Patient presents nausea, dizziness, and headache. PT recommended patient to stay on more night and work with patient tomorrow. Notified Dr. August Saucerean and canceled discharge order.

## 2015-02-07 NOTE — Anesthesia Postprocedure Evaluation (Signed)
Anesthesia Post Note  Patient: Amber Warner  Procedure(s) Performed: Procedure(s) (LRB): OPEN REDUCTION INTERNAL FIXATION (ORIF) ANKLE FRACTURE (Left)  Patient location during evaluation: PACU Anesthesia Type: General Level of consciousness: sedated Pain management: pain level controlled Vital Signs Assessment: post-procedure vital signs reviewed and stable Respiratory status: spontaneous breathing and respiratory function stable Cardiovascular status: stable Anesthetic complications: no   Grier Vu DANIEL

## 2015-02-08 DIAGNOSIS — S82492A Other fracture of shaft of left fibula, initial encounter for closed fracture: Secondary | ICD-10-CM | POA: Diagnosis not present

## 2015-02-08 LAB — PROTIME-INR
INR: 1.19 (ref 0.00–1.49)
Prothrombin Time: 15.3 seconds — ABNORMAL HIGH (ref 11.6–15.2)

## 2015-02-08 MED ORDER — WHITE PETROLATUM GEL
Status: AC
  Administered 2015-02-08: 0.2
  Filled 2015-02-08: qty 1

## 2015-02-08 NOTE — Progress Notes (Addendum)
Subjective: Patient stable pain controlled  she is having some pain on the lateral aspect of the ankle   Objective: Vital signs in last 24 hours: Temp:  [98 F (36.7 C)-99.1 F (37.3 C)] 98.2 F (36.8 C) (01/15 0459) Pulse Rate:  [80-96] 80 (01/15 0459) Resp:  [16-18] 18 (01/15 0459) BP: (99-125)/(44-50) 113/47 mmHg (01/15 0459) SpO2:  [91 %-93 %] 93 % (01/15 0459)  Intake/Output from previous day: 01/14 0701 - 01/15 0700 In: 480 [P.O.:480] Out: -  Intake/Output this shift:    Exam:  Dorsiflexion/Plantar flexion intact  Labs:  Recent Labs  02/06/15 1210  HGB 12.9    Recent Labs  02/06/15 1210  WBC 6.8  RBC 3.99  HCT 39.6  PLT 175    Recent Labs  02/06/15 1210  NA 141  K 4.4  CL 106  CO2 24  BUN 11  CREATININE 0.48  GLUCOSE 93  CALCIUM 8.9    Recent Labs  02/07/15 0555 02/08/15 0412  INR 1.23 1.19    Assessment/Plan: Impression is patient is slow to mobilize following ankle surgery. Patient is taking her own Tegretol from home. She has had seizures all her life and did have one this morning per her caregiver. Plan is for possible discharge tomorrow after physical therapy if she is safe at that time   [facility] LLCDEAN,GREGORY SCOTT 02/08/2015, 12:48 PM

## 2015-02-09 ENCOUNTER — Encounter (HOSPITAL_COMMUNITY): Payer: Self-pay | Admitting: Orthopedic Surgery

## 2015-02-09 DIAGNOSIS — S82492A Other fracture of shaft of left fibula, initial encounter for closed fracture: Secondary | ICD-10-CM | POA: Diagnosis not present

## 2015-02-09 LAB — PROTIME-INR
INR: 1.08 (ref 0.00–1.49)
Prothrombin Time: 14.2 seconds (ref 11.6–15.2)

## 2015-02-09 MED ORDER — HYDROCODONE-ACETAMINOPHEN 5-325 MG PO TABS
1.0000 | ORAL_TABLET | ORAL | Status: AC | PRN
Start: 2015-02-09 — End: ?

## 2015-02-09 NOTE — Progress Notes (Signed)
Patient A/O, no noted distress Patient tolerated all meds. LBM 02/08/15, large. Staff will continue to monitor and meet needs.

## 2015-02-09 NOTE — Progress Notes (Signed)
Physical Therapy Treatment Patient Details Name: Amber Warner MRN: [id number] DOB: 1965/02/07 Today's Date: 02/09/2015    History of Present Illness Patient is a 50 y/o female with hx of seizures, pulmonary HTN, bil PEs, HLD s/p ORIF left ankle due to fall.    PT Comments    Transfer training performed with pt and mother with pt maintaining WB status. Continue to progress as tolerated with anticipated d/c home with HHPT.  Follow Up Recommendations  Home health PT;Supervision/Assistance - 24 hour     Equipment Recommendations  Rolling walker with 5" wheels    Recommendations for Other Services OT consult     Precautions / Restrictions Precautions Precautions: Fall Required Braces or Orthoses: Other Brace/Splint Other Brace/Splint: CAM boot Restrictions Weight Bearing Restrictions: Yes LLE Weight Bearing: Touchdown weight bearing    Mobility  Bed Mobility               General bed mobility comments: sitting on EOB with mother upon entering room  Transfers Overall transfer level: Needs assistance Equipment used: Rolling walker (2 wheeled) Transfers: Stand Pivot Transfers   Stand pivot transfers: Min guard;Min assist       General transfer comment: EOB to recliner chair; mother actively participating in transfer; educated pt and mother on hand placement and sequencing; pt with ability to maintain WB status throughout tranfer; max cues needed   Ambulation/Gait                 Stairs            Wheelchair Mobility    Modified Rankin (Stroke Patients Only)       Balance Overall balance assessment: Needs assistance Sitting-balance support: Feet supported;Bilateral upper extremity supported Sitting balance-Leahy Scale: Fair     Standing balance support: During functional activity Standing balance-Leahy Scale: Poor                      Cognition                            Exercises General Exercises - Lower  Extremity Straight Leg Raises: AROM;Left;10 reps;Seated    General Comments General comments (skin integrity, edema, etc.): mother present for session      Pertinent Vitals/Pain Pain Assessment: Faces Faces Pain Scale: Hurts a little bit Pain Location: L LE Pain Descriptors / Indicators: Throbbing Pain Intervention(s): Monitored during session;Premedicated before session;Repositioned    Home Living                      Prior Function            PT Goals (current goals can now be found in the care plan section) Acute Rehab PT Goals Patient Stated Goal: get home Progress towards PT goals: Progressing toward goals    Frequency  Min 3X/week    PT Plan Current plan remains appropriate    Co-evaluation             End of Session Equipment Utilized During Treatment: Gait belt Activity Tolerance: Patient tolerated treatment well Patient left: in chair;with call bell/phone within reach;with family/visitor present     Time: [id number] PT Time Calculation (min) (ACUTE ONLY): 33 min  Charges:  $Therapeutic Activity: 23-37 mins                    G Codes:      Hope PigeonKellyn R Ortencia Askari  Ladona Ridgel, PTA Pager: [phone]   02/09/2015, 3:56 PM

## 2015-02-09 NOTE — Care Management Note (Signed)
Case Management Note  Patient Details  Name: Alric QuanMichelle A Boyack MRN: [id number] Date of Birth: Sep 22, 1965  Subjective/Objective:     S/p left ankle ORIF               Action/Plan: Spoke with patient and her mother, who is patient's caregiver, I explained that PT is recommending HHPT.Mother stated that she is a retired Charity fundraiserN and has been taking care of patient and does not think that patient needs HHPT. Patient's mother stated that she knows how to do the home exercises and how to transfer the patient. I offered to set up HHPT and then they can cancel if needed but mother did not want to have HHPT set up. Patient in agreement with mother's decision. Mother stated that if she changes her mind she will contact Dr. Audrie Liauda's office or patient's PCP. Contacted James with [employer], he delivered extending left leg rest and attached it to patient's wheelchair.He also delivered a rolling waker. No other equipment needs identified.         Expected Discharge Date:                  Expected Discharge Plan:  Home/Self Care  In-House Referral:  NA  Discharge planning Services  CM Consult  Post Acute Care Choice:  Durable Medical Equipment Choice offered to:  Parent, Patient  DME Arranged:  Walker rolling, Other see comment DME Agency:  [facility]  HH Arranged:    HH Agency:     Status of Service:  Completed, signed off  Medicare Important Message Given:    Date Medicare IM Given:    Medicare IM give by:    Date Additional Medicare IM Given:    Additional Medicare Important Message give by:     If discussed at Long Length of Stay Meetings, dates discussed:    Additional Comments:  Monica BectonKrieg, Nayra Coury Watson, RN 02/09/2015, 4:19 PM

## 2015-02-09 NOTE — Progress Notes (Signed)
PT eval addendum - Added G-codes    02/07/15 1516  PT G-Codes **NOT FOR INPATIENT CLASS**  Functional Assessment Tool Used clinical judgment  Functional Limitation Mobility: Walking and moving around  Mobility: Walking and Moving Around Current Status [id number]([id number]) CJ  Mobility: Walking and Moving Around Goal Status ([id number](G8979) CJ    Mylo RedShauna Hudsyn Barich, PT, DPT [phone]

## 2015-02-12 ENCOUNTER — Emergency Department (HOSPITAL_COMMUNITY): Payer: Medicare Other

## 2015-02-12 ENCOUNTER — Inpatient Hospital Stay (HOSPITAL_COMMUNITY)
Admission: EM | Admit: 2015-02-12 | Discharge: 2015-02-14 | DRG: 918 | Disposition: A | Discharge status: Home / Self Care | Payer: Medicare Other | Attending: Internal Medicine | Admitting: Internal Medicine

## 2015-02-12 ENCOUNTER — Encounter (HOSPITAL_COMMUNITY): Payer: Self-pay | Admitting: *Deleted

## 2015-02-12 DIAGNOSIS — G40909 Epilepsy, unspecified, not intractable, without status epilepticus: Secondary | ICD-10-CM | POA: Diagnosis present

## 2015-02-12 DIAGNOSIS — I272 Other secondary pulmonary hypertension: Secondary | ICD-10-CM | POA: Diagnosis present

## 2015-02-12 DIAGNOSIS — Z7901 Long term (current) use of anticoagulants: Secondary | ICD-10-CM

## 2015-02-12 DIAGNOSIS — T421X1A Poisoning by iminostilbenes, accidental (unintentional), initial encounter: Secondary | ICD-10-CM | POA: Diagnosis not present

## 2015-02-12 DIAGNOSIS — Y92009 Unspecified place in unspecified non-institutional (private) residence as the place of occurrence of the external cause: Secondary | ICD-10-CM

## 2015-02-12 DIAGNOSIS — Z8249 Family history of ischemic heart disease and other diseases of the circulatory system: Secondary | ICD-10-CM

## 2015-02-12 DIAGNOSIS — R4781 Slurred speech: Secondary | ICD-10-CM | POA: Diagnosis not present

## 2015-02-12 DIAGNOSIS — R270 Ataxia, unspecified: Secondary | ICD-10-CM | POA: Diagnosis present

## 2015-02-12 DIAGNOSIS — E785 Hyperlipidemia, unspecified: Secondary | ICD-10-CM | POA: Diagnosis present

## 2015-02-12 DIAGNOSIS — Z86711 Personal history of pulmonary embolism: Secondary | ICD-10-CM

## 2015-02-12 LAB — COMPREHENSIVE METABOLIC PANEL
ALK PHOS: 84 U/L (ref 38–126)
ALT: 20 U/L (ref 14–54)
AST: 22 U/L (ref 15–41)
Albumin: 3.6 g/dL (ref 3.5–5.0)
Anion gap: 7 (ref 5–15)
BUN: 15 mg/dL (ref 6–20)
CALCIUM: 8.8 mg/dL — AB (ref 8.9–10.3)
CHLORIDE: 108 mmol/L (ref 101–111)
CO2: 28 mmol/L (ref 22–32)
CREATININE: 0.56 mg/dL (ref 0.44–1.00)
GFR calc Af Amer: >60 mL/min (ref >60)
GFR calc non Af Amer: >60 mL/min (ref >60)
GLUCOSE: 121 mg/dL — AB (ref 65–99)
Potassium: 4.5 mmol/L (ref 3.5–5.1)
SODIUM: 143 mmol/L (ref 135–145)
Total Bilirubin: 0.4 mg/dL (ref 0.3–1.2)
Total Protein: 7.3 g/dL (ref 6.5–8.1)

## 2015-02-12 LAB — URINALYSIS, ROUTINE W REFLEX MICROSCOPIC
BILIRUBIN URINE: NEGATIVE
GLUCOSE, UA: NEGATIVE mg/dL
HGB URINE DIPSTICK: NEGATIVE
Leukocytes, UA: NEGATIVE
Nitrite: NEGATIVE
PH: 5 (ref 5.0–8.0)
Protein, ur: NEGATIVE mg/dL
Specific Gravity, Urine: >1.03 — ABNORMAL HIGH (ref 1.005–1.030)

## 2015-02-12 LAB — PROTIME-INR
INR: 1.88 — ABNORMAL HIGH (ref 0.00–1.49)
Prothrombin Time: 21.5 seconds — ABNORMAL HIGH (ref 11.6–15.2)

## 2015-02-12 LAB — DIFFERENTIAL
Basophils Absolute: 0 10*3/uL (ref 0.0–0.1)
Basophils Relative: 0 %
Eosinophils Absolute: 0 10*3/uL (ref 0.0–0.7)
Eosinophils Relative: 0 %
LYMPHS ABS: 0.6 10*3/uL — AB (ref 0.7–4.0)
LYMPHS PCT: 6 %
MONO ABS: 0.4 10*3/uL (ref 0.1–1.0)
Monocytes Relative: 4 %
NEUTROS ABS: 9.3 10*3/uL — AB (ref 1.7–7.7)
Neutrophils Relative %: 90 %

## 2015-02-12 LAB — I-STAT CHEM 8, ED
BUN: 18 mg/dL (ref 6–20)
CHLORIDE: 110 mmol/L (ref 101–111)
CREATININE: 0.5 mg/dL (ref 0.44–1.00)
Calcium, Ion: 0.99 mmol/L — ABNORMAL LOW (ref 1.12–1.23)
Glucose, Bld: 120 mg/dL — ABNORMAL HIGH (ref 65–99)
HEMATOCRIT: 43 % (ref 36.0–46.0)
HEMOGLOBIN: 14.6 g/dL (ref 12.0–15.0)
POTASSIUM: 5.9 mmol/L — AB (ref 3.5–5.1)
Sodium: 140 mmol/L (ref 135–145)
TCO2: 25 mmol/L (ref 0–100)

## 2015-02-12 LAB — CBC
HEMATOCRIT: 41.1 % (ref 36.0–46.0)
HEMOGLOBIN: 13.2 g/dL (ref 12.0–15.0)
MCH: 32.7 pg (ref 26.0–34.0)
MCHC: 32.1 g/dL (ref 30.0–36.0)
MCV: 101.7 fL — AB (ref 78.0–100.0)
PLATELETS: 162 10*3/uL (ref 150–400)
RBC: 4.04 MIL/uL (ref 3.87–5.11)
RDW: 12.7 % (ref 11.5–15.5)
WBC: 10.4 10*3/uL (ref 4.0–10.5)

## 2015-02-12 LAB — RAPID URINE DRUG SCREEN, HOSP PERFORMED
AMPHETAMINES: NOT DETECTED
BARBITURATES: NOT DETECTED
BENZODIAZEPINES: NOT DETECTED
COCAINE: NOT DETECTED
Opiates: POSITIVE — AB
Tetrahydrocannabinol: NOT DETECTED

## 2015-02-12 LAB — APTT: aPTT: 30 seconds (ref 24–37)

## 2015-02-12 LAB — CBG MONITORING, ED: GLUCOSE-CAPILLARY: 107 mg/dL — AB (ref 65–99)

## 2015-02-12 LAB — ACETAMINOPHEN LEVEL: Acetaminophen (Tylenol), Serum: 17 ug/mL (ref 10–30)

## 2015-02-12 LAB — ETHANOL: Alcohol, Ethyl (B): <5 mg/dL (ref <5)

## 2015-02-12 LAB — I-STAT TROPONIN, ED: TROPONIN I, POC: 0 ng/mL (ref 0.00–0.08)

## 2015-02-12 LAB — CARBAMAZEPINE LEVEL, TOTAL: CARBAMAZEPINE LVL: 22.8 ug/mL — AB (ref 4.0–12.0)

## 2015-02-12 MED ORDER — PANTOPRAZOLE SODIUM 40 MG PO TBEC
40.0000 mg | DELAYED_RELEASE_TABLET | Freq: Every day | ORAL | Status: DC
  Administered 2015-02-12 – 2015-02-14 (×3): 40 mg via ORAL
  Filled 2015-02-12 (×3): qty 1

## 2015-02-12 MED ORDER — MAGNESIUM OXIDE 400 (241.3 MG) MG PO TABS
200.0000 mg | ORAL_TABLET | Freq: Every day | ORAL | Status: DC
  Administered 2015-02-12 – 2015-02-14 (×3): 200 mg via ORAL
  Filled 2015-02-12 (×3): qty 1

## 2015-02-12 MED ORDER — SODIUM CHLORIDE 0.9 % IV BOLUS (SEPSIS)
1000.0000 mL | Freq: Once | INTRAVENOUS | Status: AC
Start: 2015-02-12 — End: 2015-02-12
  Administered 2015-02-12: 1000 mL via INTRAVENOUS

## 2015-02-12 MED ORDER — MAGNESIUM 200 MG PO TABS: 250.0000 mg | ORAL_TABLET | Freq: Every day | ORAL | Status: DC

## 2015-02-12 MED ORDER — ENOXAPARIN SODIUM 40 MG/0.4ML ~~LOC~~ SOLN
40.0000 mg | SUBCUTANEOUS | Status: DC
  Administered 2015-02-12 – 2015-02-13 (×2): 40 mg via SUBCUTANEOUS
  Filled 2015-02-12 (×2): qty 0.4

## 2015-02-12 MED ORDER — RED YEAST RICE 600 MG PO CAPS: 2.0000 | ORAL_CAPSULE | Freq: Every day | ORAL | Status: DC

## 2015-02-12 MED ORDER — SODIUM CHLORIDE 0.9 % IJ SOLN
3.0000 mL | Freq: Two times a day (BID) | INTRAMUSCULAR | Status: DC
  Administered 2015-02-12 – 2015-02-14 (×3): 3 mL via INTRAVENOUS

## 2015-02-12 MED ORDER — IOHEXOL 350 MG/ML SOLN
100.0000 mL | Freq: Once | INTRAVENOUS | Status: AC | PRN
  Administered 2015-02-12: 75 mL via INTRAVENOUS

## 2015-02-12 MED ORDER — HYDROCODONE-ACETAMINOPHEN 5-325 MG PO TABS
1.0000 | ORAL_TABLET | ORAL | Status: DC | PRN
  Administered 2015-02-12 – 2015-02-13 (×4): 1 via ORAL
  Administered 2015-02-13 – 2015-02-14 (×4): 2 via ORAL
  Filled 2015-02-12: qty 1
  Filled 2015-02-12: qty 2
  Filled 2015-02-12: qty 1
  Filled 2015-02-12 (×4): qty 2

## 2015-02-12 MED ORDER — NITROFURANTOIN MACROCRYSTAL 50 MG PO CAPS
50.0000 mg | ORAL_CAPSULE | Freq: Every day | ORAL | Status: DC
  Administered 2015-02-12 – 2015-02-13 (×2): 50 mg via ORAL
  Filled 2015-02-12 (×4): qty 1

## 2015-02-12 MED ORDER — SODIUM CHLORIDE 0.9 % IV SOLN: Freq: Once | INTRAVENOUS | Status: DC

## 2015-02-12 MED ORDER — VITAMIN C 500 MG PO TABS
500.0000 mg | ORAL_TABLET | Freq: Every day | ORAL | Status: DC
  Administered 2015-02-12 – 2015-02-14 (×3): 500 mg via ORAL
  Filled 2015-02-12 (×3): qty 1

## 2015-02-12 MED ORDER — FERROUS SULFATE 325 (65 FE) MG PO TABS
325.0000 mg | ORAL_TABLET | Freq: Every day | ORAL | Status: DC
  Administered 2015-02-13 – 2015-02-14 (×2): 325 mg via ORAL
  Filled 2015-02-12 (×2): qty 1

## 2015-02-12 MED ORDER — LAMOTRIGINE 100 MG PO TABS
150.0000 mg | ORAL_TABLET | Freq: Every day | ORAL | Status: DC
  Administered 2015-02-12 – 2015-02-14 (×3): 150 mg via ORAL
  Filled 2015-02-12 (×3): qty 2

## 2015-02-12 NOTE — ED Notes (Signed)
Pt's speech becoming more clear.

## 2015-02-12 NOTE — ED Notes (Signed)
Critical tegretol level 22.8 reported to EDP.

## 2015-02-12 NOTE — ED Notes (Signed)
Patient's mother is here, states pt. Woke up and took her medicine at 0430 and was speaking and acting normally. Pt. Then went back to sleep and around 0830-0900, she tried to talk to patient and patient had slurred speech and mother states that she noticed some facial dropping on the left. Pt reports patient is talking more clearly now but still feels that the left side of her mouth is drooping and right eyelid is not opening at patient's baseline.

## 2015-02-12 NOTE — ED Notes (Signed)
Lab at bedside

## 2015-02-12 NOTE — Progress Notes (Signed)
PHARMACIST - PHYSICIAN ORDER COMMUNICATION  CONCERNING: P&T Medication Policy on Herbal Medications  DESCRIPTION:  This patient's order for:  Red Yeast Rice  has been noted.  This product(s) is classified as an "herbal" or natural product. Due to a lack of definitive safety studies or FDA approval, nonstandard manufacturing practices, plus the potential risk of unknown drug-drug interactions while on inpatient medications, the Pharmacy and Therapeutics Committee does not permit the use of "herbal" or natural products of this type within [facility].   ACTION TAKEN: The pharmacy department is unable to verify this order at this time and your patient has been informed of this safety policy. Please reevaluate patient's clinical condition at discharge and address if the herbal or natural product(s) should be resumed at that time.     Scharlene Gloss, PharmD

## 2015-02-12 NOTE — ED Notes (Signed)
Called SOC at 9:58

## 2015-02-12 NOTE — ED Notes (Signed)
Tele neuro consult began

## 2015-02-12 NOTE — ED Notes (Addendum)
Pt. Still seems sleepy, will wait on stroke swallow screen. Pt remains NPO.

## 2015-02-12 NOTE — H&P (Signed)
Amber Warner History and Physical  [name] BJY:[id number] DOB: 1965/06/09    PCP:   Josue Hector, MD   Chief Complaint: Ataxia and slurred speech.   HPI: Amber Warner is an 50 y.o. female with hx of seizure, prior pulmonary embolism on Coumadin, HLD, Pulm HTN, brought to the ER for ataxia and slurred speech as a code stroke.  In the ER, she was originally less responsive, but became more responsive.  Evaluation was found to have toxic level of Tegretol at 22.8 (normal 4-12), with CT head showing stable cerebella atropy.  Neuro consult over the phone recommended CTA of the brain, which showed non significant stenosis.  Serology was unremarkable with normal Cr and no leukocytosis, with normal Hb.  EKG showed NSR with no QTc prolongation.  She became more responsive, conversing, and Code Stroke was cancelled.   Rewiew of Systems:  Constitutional: Negative for malaise, fever and chills. No significant weight loss or weight gain Eyes: Negative for eye pain, redness and discharge, diplopia, visual changes, or flashes of light. ENMT: Negative for ear pain, hoarseness, nasal congestion, sinus pressure and sore throat. No headaches; tinnitus, drooling, or problem swallowing. Cardiovascular: Negative for chest pain, palpitations, diaphoresis, dyspnea and peripheral edema. ; No orthopnea, PND Respiratory: Negative for cough, hemoptysis, wheezing and stridor. No pleuritic chestpain. Gastrointestinal: Negative for nausea, vomiting, diarrhea, constipation, abdominal pain, melena, blood in stool, hematemesis, jaundice and rectal bleeding.    Genitourinary: Negative for frequency, dysuria, incontinence,flank pain and hematuria; Musculoskeletal: Negative for back pain and neck pain. Negative for swelling and trauma.;  Skin: . Negative for pruritus, rash, abrasions, bruising and skin lesion.; ulcerations Neuro: Negative for headache, lightheadedness and neck stiffness. Negative for  weakness, altered level of consciousness , altered mental status, extremity weakness, burning feet, involuntary movement, seizure and syncope.  Psych: negative for anxiety, depression, insomnia, tearfulness, panic attacks, hallucinations, paranoia, suicidal or homicidal ideation    Past Medical History  Diagnosis Date  . Seizures (HCC)   . Pulmonary hypertension (HCC) 05/22/2013  . Bilateral pulmonary embolism (HCC) 05/21/2013  . 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 Hyperlipidemia 05/25/2013  . 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 Hemorrhoid surgery    . Orif ankle fracture Left 02/06/2015    Procedure: OPEN REDUCTION INTERNAL FIXATION (ORIF) ANKLE FRACTURE;  Surgeon: Nadara Mustard, MD;  Location: [facility] OR;  Service: Orthopedics;  Laterality: Left;    Medications:  HOME MEDS: Prior to Admission medications   Medication Sig Start Date End Date Taking? Authorizing Provider  benzonatate (TESSALON) 100 MG capsule Take 1 capsule (100 mg total) by mouth 3 (three) times daily as needed for cough. 05/25/13  Yes Elliot Cousin, MD  CALCIUM-VITAMIN D PO Take 1 tablet by mouth daily.   Yes Historical Provider, MD  carBAMazepine (TEGRETOL) 100 MG/5ML suspension Take 100 mg by mouth 2 (two) times daily.   Yes Historical Provider, MD  carbamazepine (TEGRETOL) 200 MG tablet Take 200 mg by mouth 4 (four) times daily.   Yes Historical Provider, MD  ferrous sulfate 325 (65 FE) MG tablet Take 325 mg by mouth daily with breakfast.   Yes Historical Provider, MD  HYDROcodone-acetaminophen (NORCO/VICODIN) 5-325 MG tablet Take 1-2 tablets by mouth every 4 (four) hours as needed for moderate pain (breakthrough pain). 02/09/15  Yes Kathryne Hitch, MD  lamoTRIgine (LAMICTAL) 150 MG tablet Take 150 mg by mouth daily. 02/02/15  Yes  Historical Provider, MD  MAGNESIUM PO Take 250 mg by mouth daily.    Yes Historical Provider, MD   Multiple Vitamin (MULTIVITAMIN WITH MINERALS) TABS tablet Take 1 tablet by mouth daily.   Yes Historical Provider, MD  nitrofurantoin (MACRODANTIN) 50 MG capsule Take 50 mg by mouth at bedtime. 01/16/15  Yes Historical Provider, MD  OVER THE COUNTER MEDICATION Take 2 tablets by mouth 2 (two) times daily. Glucosamine Sulfate    Yes Historical Provider, MD  pantoprazole (PROTONIX) 40 MG tablet Take 40 mg by mouth daily. 01/15/15  Yes Historical Provider, MD  potassium chloride SA (K-DUR,KLOR-CON) 20 MEQ tablet Take 20 mEq by mouth daily.   Yes Historical Provider, MD  Red Yeast Rice 600 MG CAPS Take 2 capsules by mouth at bedtime.   Yes Historical Provider, MD  vitamin C (ASCORBIC ACID) 500 MG tablet Take 500 mg by mouth daily.   Yes Historical Provider, MD  warfarin (COUMADIN) 5 MG tablet Take 5-10 mg by mouth daily. Take 5 mg by mouth on Mon, and Wed. Take 10 mg by mouth on Sun, Tue, Thur, Fri, Sat, Sun   Yes Historical Provider, MD  atorvastatin (LIPITOR) 10 MG tablet Take 1 tablet (10 mg total) by mouth daily at 6 PM. For treatment of your high cholesterol. Patient not taking: Reported on 02/05/2015 05/25/13   Elliot Cousin, MD  enoxaparin (LOVENOX) 150 MG/ML injection Inject 1 mL (150 mg total) into the skin daily. Patient not taking: Reported on 02/05/2015 05/25/13   Elliot Cousin, MD  lamoTRIgine (LAMICTAL) 25 MG tablet Take 1 tablet (25 mg total) by mouth 2 (two) times daily. New seizure medicine. Patient not taking: Reported on 02/05/2015 05/25/13   Elliot Cousin, MD     Allergies:  No Known Allergies  Social History:   reports that she has never smoked. She has never used smokeless tobacco. She reports that she does not drink alcohol or use illicit drugs.  Family History: Family History  Problem Relation Age of Onset  . Hypertension Mother   . Diabetes Mellitus II Maternal Aunt   . Stroke Father   . Heart attack Father 32    died at age 82     Physical Exam: Filed Vitals:    02/12/15 1245 02/12/15 1340 02/12/15 1349 02/12/15 1400  BP:   137/69 133/73  Pulse: 84 85    Temp:      Resp: SpO2: 95% 96%     Blood pressure 133/73, pulse 85, temperature 97.4 F (36.3 C), resp. rate 12, last menstrual period 01/10/2015, SpO2 96 %.  GEN:  Pleasant  patient lying in the stretcher in no acute distress; cooperative with exam. PSYCH:  alert and oriented x4; does not appear anxious or depressed; affect is appropriate. HEENT: Mucous membranes pink and anicteric; PERRLA; EOM intact; no cervical lymphadenopathy nor thyromegaly or carotid bruit; no JVD; There were no stridor. Neck is very supple. Breasts:: Not examined CHEST WALL: No tenderness CHEST: Normal respiration, clear to auscultation bilaterally.  HEART: Regular rate and rhythm.  There are no murmur, rub, or gallops.   BACK: No kyphosis or scoliosis; no CVA tenderness ABDOMEN: soft and non-tender; no masses, no organomegaly, normal abdominal bowel sounds; no pannus; no intertriginous candida. There is no rebound and no distention. Rectal Exam: Not done EXTREMITIES: No bone or joint deformity; age-appropriate arthropathy of the hands and knees; no edema; no ulcerations.  There is no calf tenderness. Genitalia: not examined PULSES: 2+  and symmetric SKIN: Normal hydration no rash or ulceration CNS: Cranial nerves 2-12 grossly intact no focal lateralizing neurologic deficit.  Speech is fluent; uvula elevated with phonation, facial symmetry and tongue midline. DTR are normal bilaterally, cerebella exam is intact, barbinski is negative and strengths are equaled bilaterally.  No sensory loss.   Labs on Admission:  Basic Metabolic Panel:  Recent Labs Lab 02/06/15 1210 02/12/15 1012 02/12/15 1017  NA 141 140 143  K 4.4 5.9* 4.5  CL 106 110 108  CO2 24  --  28  GLUCOSE 93 120* 121*  BUN 11 18 15   CREATININE 0.48 0.50 0.56  CALCIUM 8.9  --  8.8*   Liver Function Tests:  Recent Labs Lab  02/06/15 1210 02/12/15 1017  AST 20 22  ALT 15 20  ALKPHOS 78 84  BILITOT 0.6 0.4  PROT 6.3* 7.3  ALBUMIN 3.2* 3.6   CBC:  Recent Labs Lab 02/06/15 1210 02/12/15 1012 02/12/15 1017  WBC 6.8  --  10.4  NEUTROABS  --   --  9.3*  HGB 12.9 14.6 13.2  HCT 39.6 43.0 41.1  MCV 99.2  --  101.7*  PLT 175  --  162    CBG:  Recent Labs Lab 02/12/15 0953  GLUCAP 107*     Radiological Exams on Admission: Ct Angio Head W/cm &/or Wo Cm  02/12/2015  CLINICAL DATA:  Headache and vomiting today. Slurred speech. Left-sided facial droop. Right eyelid will not open. Ataxia. The initial bolus of 75 mL contrast was nondiagnostic. A second bolus was performed. These images were diagnostic. EXAM: CT ANGIOGRAPHY HEAD TECHNIQUE: Multidetector CT imaging of the head was performed using the standard protocol during bolus administration of intravenous contrast. Multiplanar CT image reconstructions and MIPs were obtained to evaluate the vascular anatomy. CONTRAST:  150 mL OMNIPAQUE IOHEXOL 350 MG/ML SOLN COMPARISON:  CT head without contrast from the same day and 05/20/2013. FINDINGS: CT HEAD Brain: D source images demonstrate moderate periventricular white matter disease bilaterally. There is hyperdense irregularity along the ependyma bilaterally without significant interval change Calvarium and skull base: The calvarium is intact. The skullbase is unremarkable. Paranasal sinuses: The paranasal sinuses and mastoid air cells are clear. Orbits: Globes and orbits are intact. CTA HEAD Anterior circulation: Atherosclerotic calcifications are present within the cavernous internal carotid arteries without a significant stenosis. There is mild narrowing of proximal right A1 segment. The left A1 segment is within normal limits. No definite anterior communicating artery is present. There is mild narrowing in the a 2 segments and ACA branch vessels bilaterally. The MCA bifurcations are intact. There is moderate  attenuation of proximal branch vessels on the left. More mild segmental narrowing is present on the right. Posterior circulation: The vertebral arteries are codominant. The PICA origins are visualized and normal. The basilar artery is within normal limits. Both posterior cerebral arteries originate from the basilar tip. There is mild attenuation of distal PCA branch vessels bilaterally. Venous sinuses: The dural sinuses are patent. The left transverse sinus is dominant. The straight sinus and deep cerebral veins are intact. The cortical veins are unremarkable. Anatomic variants: None. Delayed phase:The postcontrast images demonstrate no pathologic enhancement. IMPRESSION: 1. Minimal atherosclerotic calcifications within the cavernous internal carotid arteries bilaterally without a significant stenosis. 2. Mild narrowing of proximal right A1 segment. 3. Mild narrowing within the a 2 segments, bilaterally ACA branch vessels, and bilateral MCA branch vessels bilaterally without a significant proximal stenosis. 4. Distal PCA branch vessel attenuation bilaterally.  5. 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 Electronically Signed   By: Marin Roberts M.D.   On: 02/12/2015 12:19   Ct Head Wo Contrast  02/12/2015  CLINICAL DATA:  Code stroke EXAM: CT HEAD WITHOUT CONTRAST TECHNIQUE: Contiguous axial images were obtained from the base of the skull through the vertex without intravenous contrast. COMPARISON:  05/20/2013 FINDINGS: No skull fracture is noted. Paranasal sinuses and mastoid air cells are unremarkable. Stable patchy decreased attenuation periventricular and deep white matter bilateral cerebral hemispheres consistent with chronic small vessel ischemic changes. No definite acute cortical infarction. Again noted nodularity along the edges of lateral ventricles bilaterally suspicious for migration abnormality. Stable cerebellar atrophy. No mass lesion is noted on  this unenhanced scan. Ventricular size is stable from prior exam. There is no intraventricular hemorrhage. IMPRESSION: No acute intracranial abnormality. No definite acute cortical infarction. Stable chronic findings as described above. Stable cerebellar atrophy. These results were called by telephone at the time of interpretation on 02/12/2015 at 10:13 am to Dr. Rolland Porter , who verbally acknowledged these results. Electronically Signed   By: Natasha Mead M.D.   On: 02/12/2015 10:14    Assessment/Plan Present on Admission:  . Tegretol toxicity . Morbid obesity (HCC)  PLAN:  Will admit her OBS for tegretal toxicity.  She is doing better and will be monitored.  Hold Tegretal and when therapeutic, will resume at lower dose.   All other meds will be continued.  She is stable, full code, and will be admitted to [facility].  Spoke with her mother of plan.   Other plans as per orders. Code Status: FULL Unk Lightning, MD. FACP Amber Warner Pager [phone] 7pm to 7am.  02/12/2015, 4:23 PM

## 2015-02-12 NOTE — ED Notes (Addendum)
Per mother, pt was awake this morning at 0400 and taking her meds. Pt was speaking normally at 0400, with no slurred speech. Pt presents to ED with slurred speech. Alert, talking with EDP presently. Pt has a hx of seizures. CBG 107 upon arrival.

## 2015-02-12 NOTE — ED Notes (Signed)
Pt arrived, EDP at bedside.

## 2015-02-12 NOTE — ED Notes (Signed)
Pt back from CT

## 2015-02-12 NOTE — Progress Notes (Signed)
0930: call from ER/ possible CVA coming in 0945: patient brought into ER by EMS/ one tech was in ER and waited with RN to bring patient to CT. 1000: tech and RN brought patient into CT 1005: tech and RN took patient back to ER/ information was called to GRA and study was started in EPIC 1013: report called to EDP by Radiologist/ final in EPIC 1015: exam was completed by tech in EPIC (could not complete earlier due to Rad being locked on patient.)

## 2015-02-12 NOTE — ED Notes (Signed)
Paged Code Stroke at 9:56

## 2015-02-12 NOTE — ED Notes (Signed)
Pt. C/o headache during transport to CTA.

## 2015-02-12 NOTE — ED Notes (Signed)
Pt. Transported to CT by nurse.

## 2015-02-12 NOTE — ED Provider Notes (Signed)
CSN: [id number]     Arrival date & time 02/12/15  4098 History  By signing my name below, I, Evon Slack, attest that this documentation has been prepared under the direction and in the presence of Rolland Porter, MD. Electronically Signed: Evon Slack, ED Scribe. 02/12/2015. 11:18 AM.   Chief Complaint  Patient presents with  . Code Stroke    The history is provided by the EMS personnel. No language interpreter was used.   HPI Comments: Level 5 Caveat: Mental status Change Amber Warner is a 50 y.o. female who presents to the Emergency Department complaining of possible code stroke onset this morning at 4 Am. Per EMS pt was last seen normal at 4 AM this morning. They states that she was talking normal and able to take her own medications this morning. Pt presents with slurred speech. Ems states that pt has had some left sided arm weakness but normal grip strength. Ems reports bowel incontinence as well. No facial droop present.       Past Medical History  Diagnosis Date  . Seizures (HCC)   . Pulmonary hypertension (HCC) 05/22/2013  . Bilateral pulmonary embolism (HCC) 05/21/2013  . 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 Hyperlipidemia 05/25/2013  . 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 Hemorrhoid surgery    . Orif ankle fracture Left 02/06/2015    Procedure: OPEN REDUCTION INTERNAL FIXATION (ORIF) ANKLE FRACTURE;  Surgeon: Nadara Mustard, MD;  Location: [facility] OR;  Service: Orthopedics;  Laterality: Left;   Family History  Problem Relation Age of Onset  . Hypertension Mother   . Diabetes Mellitus II Maternal Aunt   . Stroke Father   . Heart attack Father 70    died at age 65   Social History  Substance Use Topics  . Smoking status: Never Smoker   . Smokeless tobacco: Never Used  . Alcohol Use: No   OB History    No data available     Review of Systems  Unable to perform ROS: Mental  status change  Neurological: Positive for speech difficulty.      Allergies  Review of patient's allergies indicates no known allergies.  Home Medications   Prior to Admission medications   Medication Sig Start Date End Date Taking? Authorizing Provider  benzonatate (TESSALON) 100 MG capsule Take 1 capsule (100 mg total) by mouth 3 (three) times daily as needed for cough. 05/25/13  Yes Elliot Cousin, MD  CALCIUM-VITAMIN D PO Take 1 tablet by mouth daily.   Yes Historical Provider, MD  carBAMazepine (TEGRETOL) 100 MG/5ML suspension Take 100 mg by mouth 2 (two) times daily.   Yes Historical Provider, MD  carbamazepine (TEGRETOL) 200 MG tablet Take 200 mg by mouth 4 (four) times daily.   Yes Historical Provider, MD  ferrous sulfate 325 (65 FE) MG tablet Take 325 mg by mouth daily with breakfast.   Yes Historical Provider, MD  HYDROcodone-acetaminophen (NORCO/VICODIN) 5-325 MG tablet Take 1-2 tablets by mouth every 4 (four) hours as needed for moderate pain (breakthrough pain). 02/09/15  Yes Kathryne Hitch, MD  lamoTRIgine (LAMICTAL) 150 MG tablet Take 150 mg by mouth daily. 02/02/15  Yes Historical Provider, MD  MAGNESIUM PO Take 250 mg by mouth daily.    Yes Historical Provider, MD  Multiple Vitamin (MULTIVITAMIN WITH MINERALS) TABS tablet Take 1 tablet by mouth daily.   Yes Historical Provider, MD  nitrofurantoin (MACRODANTIN) 50 MG  capsule Take 50 mg by mouth at bedtime. 01/16/15  Yes Historical Provider, MD  pantoprazole (PROTONIX) 40 MG tablet Take 40 mg by mouth daily. 01/15/15  Yes Historical Provider, MD  potassium chloride SA (K-DUR,KLOR-CON) 20 MEQ tablet Take 20 mEq by mouth daily.   Yes Historical Provider, MD  Red Yeast Rice 600 MG CAPS Take 2 capsules by mouth at bedtime.   Yes Historical Provider, MD  vitamin C (ASCORBIC ACID) 500 MG tablet Take 500 mg by mouth daily.   Yes Historical Provider, MD  warfarin (COUMADIN) 5 MG tablet Take 5-10 mg by mouth daily. Take 5 mg by  mouth on Mon, and Wed. Take 10 mg by mouth on Sun, Tue, Thur, Fri, Sat, Sun   Yes Historical Provider, MD  atorvastatin (LIPITOR) 10 MG tablet Take 1 tablet (10 mg total) by mouth daily at 6 PM. For treatment of your high cholesterol. Patient not taking: Reported on 02/05/2015 05/25/13   Elliot Cousin, MD   BP 138/65 mmHg  Pulse 76  Temp(Src) 98.2 F (36.8 C) (Oral)  Resp 18  SpO2 93%  LMP 01/10/2015   Physical Exam  Constitutional: She appears well-developed and well-nourished.  HENT:  Head: Normocephalic.  No contusion to the tngue  Eyes: Conjunctivae are normal. Pupils are equal, round, and reactive to light. No scleral icterus.  Neck: Normal range of motion. Neck supple. No thyromegaly present.  Cardiovascular: Normal rate and regular rhythm.  Exam reveals no gallop and no friction rub.   No murmur heard. Pulmonary/Chest: Effort normal and breath sounds normal. No respiratory distress. She has no wheezes. She has no rales.  Abdominal: Soft. Bowel sounds are normal. She exhibits no distension. There is no tenderness. There is no rebound.  Musculoskeletal: Normal range of motion.  Fracture boot LLE  Neurological: She is alert.  Listless/lethargic. Slurred speech. symmetric smile without droop, no pronator drift, oriented to person only. Ataxic with UE movements, L>R.  Skin: Skin is warm and dry. No rash noted.  Psychiatric: She has a normal mood and affect. Her behavior is normal.    ED Course  Procedures (including critical care time) DIAGNOSTIC STUDIES: Oxygen Saturation is 94% on RA, adequate by my interpretation.    COORDINATION OF CARE:    Labs Review Labs Reviewed  COMPREHENSIVE METABOLIC PANEL - Abnormal; Notable for the following:    Glucose, Bld 121 (*)    Calcium 8.8 (*)    All other components within normal limits  PROTIME-INR - Abnormal; Notable for the following:    Prothrombin Time 21.5 (*)    INR 1.88 (*)    All other components within normal limits   CBC - Abnormal; Notable for the following:    MCV 101.7 (*)    All other components within normal limits  DIFFERENTIAL - Abnormal; Notable for the following:    Neutro Abs 9.3 (*)    Lymphs Abs 0.6 (*)    All other components within normal limits  URINE RAPID DRUG SCREEN, HOSP PERFORMED - Abnormal; Notable for the following:    Opiates POSITIVE (*)    All other components within normal limits  URINALYSIS, ROUTINE W REFLEX MICROSCOPIC (NOT AT [facility]) - Abnormal; Notable for the following:    Specific Gravity, Urine >1.030 (*)    Ketones, ur TRACE (*)    All other components within normal limits  CARBAMAZEPINE LEVEL, TOTAL - Abnormal; Notable for the following:    Carbamazepine Lvl 22.8 (*)    All other components  within normal limits  PROTIME-INR - Abnormal; Notable for the following:    Prothrombin Time 15.6 (*)    All other components within normal limits  PROTIME-INR - Abnormal; Notable for the following:    Prothrombin Time 16.6 (*)    All other components within normal limits  CBG MONITORING, ED - Abnormal; Notable for the following:    Glucose-Capillary 107 (*)    All other components within normal limits  I-STAT CHEM 8, ED - Abnormal; Notable for the following:    Potassium 5.9 (*)    Glucose, Bld 120 (*)    Calcium, Ion 0.99 (*)    All other components within normal limits  ACETAMINOPHEN LEVEL  ETHANOL  APTT  CARBAMAZEPINE LEVEL, TOTAL  CARBAMAZEPINE, FREE AND TOTAL  CBC  I-STAT TROPOININ, ED    Imaging Review No results found. I have personally reviewed and evaluated these images and lab results as part of my medical decision-making.   EKG Interpretation   Date/Time:  Thursday February 12 2015 09:53:06 EST Ventricular Rate:  91 PR Interval:  178 QRS Duration: 105 QT Interval:  367 QTC Calculation: 451 R Axis:   33 Text Interpretation:  Sinus rhythm ED PHYSICIAN INTERPRETATION AVAILABLE  IN [facility] Confirmed by TEST, Record ([id number]) on 02/13/2015  6:40:04  AM      MDM   Final diagnoses:  Ataxia     Patient remains somnolent. But will awaken to voice. Patient's mother arrives. She states that the patient was normal at 4:30 and that the patient takes her own medications. When I discussed with her the possibility of Tegretol toxicity, she immediately states "I wondered if I had to be given her her medications myself I used 2. Patient takes Tegretol tablet, as well as a liquid because of a specific dose.  Appreciate neurological input. Patient originally made "code stroke" because of mental status changes and ataxia upon arrival. CT obtained. CT recommended to eliminate posterior circulation large vessel disease. This was normal other than some narrowing, and no acute findings.   I personally performed the services described in this documentation, which was scribed in my presence. The recorded information has been reviewed and is accurate.    EKG Interpretation   Date/Time:  Thursday February 12 2015 09:53:06 EST Ventricular Rate:  91 PR Interval:  178 QRS Duration: 105 QT Interval:  367 QTC Calculation: 451 R Axis:   33 Text Interpretation:  Sinus rhythm ED PHYSICIAN INTERPRETATION AVAILABLE  IN [facility] Confirmed by TEST, Record ([id number]) on 02/13/2015 6:40:04  AM           Rolland Porter, MD 02/17/15 [phone]

## 2015-02-12 NOTE — ED Notes (Signed)
Code stroke called. CT of head ordered.

## 2015-02-12 NOTE — ED Notes (Signed)
Report called to Kayline RN

## 2015-02-12 NOTE — ED Notes (Signed)
Mom reports pt. Vomited this morning during beginning of this episode with slurred speech.

## 2015-02-12 NOTE — ED Notes (Addendum)
Pt. Remained NPO while in ED, stroke screen not done due to pt. Lethargy. However pt being admitted for tegretol toxicty, not CVA.

## 2015-02-13 DIAGNOSIS — G40909 Epilepsy, unspecified, not intractable, without status epilepticus: Secondary | ICD-10-CM | POA: Diagnosis not present

## 2015-02-13 LAB — PROTIME-INR
INR: 1.33 (ref 0.00–1.49)
PROTHROMBIN TIME: 16.6 s — AB (ref 11.6–15.2)

## 2015-02-13 LAB — CARBAMAZEPINE LEVEL, TOTAL: Carbamazepine Lvl: 7.1 ug/mL (ref 4.0–12.0)

## 2015-02-13 MED ORDER — WARFARIN SODIUM 5 MG PO TABS: 10.0000 mg | ORAL_TABLET | Freq: Once | ORAL | Status: DC

## 2015-02-13 MED ORDER — WARFARIN SODIUM 5 MG PO TABS
10.0000 mg | ORAL_TABLET | Freq: Once | ORAL | Status: AC
  Administered 2015-02-13: 10 mg via ORAL
  Filled 2015-02-13: qty 2

## 2015-02-13 MED ORDER — CARBAMAZEPINE 200 MG PO TABS
200.0000 mg | ORAL_TABLET | Freq: Four times a day (QID) | ORAL | Status: DC
  Administered 2015-02-13 – 2015-02-14 (×5): 200 mg via ORAL
  Filled 2015-02-13 (×5): qty 1

## 2015-02-13 MED ORDER — WARFARIN - PHARMACIST DOSING INPATIENT: Freq: Every day | Status: DC

## 2015-02-13 MED ORDER — CARBAMAZEPINE 200 MG PO TABS
400.0000 mg | ORAL_TABLET | Freq: Once | ORAL | Status: AC
  Administered 2015-02-13: 400 mg via ORAL
  Filled 2015-02-13: qty 2

## 2015-02-13 MED ORDER — BENZONATATE 100 MG PO CAPS
100.0000 mg | ORAL_CAPSULE | Freq: Three times a day (TID) | ORAL | Status: DC | PRN
  Administered 2015-02-13: 100 mg via ORAL
  Filled 2015-02-13: qty 1

## 2015-02-13 NOTE — Progress Notes (Signed)
   02/13/15 0909  Vitals  Temp 97.8 F (36.6 C)  Temp Source Oral  BP (!) 154/72 mmHg  MAP (mmHg) 94  BP Location Left Arm  BP Method Automatic  Patient Position (if appropriate) Sitting  Pulse Rate 76  Pulse Rate Source Dinamap  Resp 16  Oxygen Therapy  SpO2 92 %  O2 Device Room Air    Pt had seizure while this nurse in to do assessment and morning med pass.  Seizure last approximately 1 minute.  Pt was alert and oriented after seizure and vital signs stable.  Notifying Dr Conley Rolls about event.  Will continue to monitor pt.

## 2015-02-13 NOTE — Care Management Obs Status (Signed)
MEDICARE OBSERVATION STATUS NOTIFICATION   Patient Details  Name: Amber Warner MRN: [id number] Date of Birth: 03-16-1965   Medicare Observation Status Notification Given:  Yes    Adonis Huguenin, RN 02/13/2015, 9:27 AM

## 2015-02-13 NOTE — Care Management Note (Signed)
Case Management Note  Patient Details  Name: MUSKAAN SMET MRN: [id number] Date of Birth: 18-Jan-1966  Subjective/Objective:                   Spoke with patient and mother at the bedside. Patient is pseciloa needs. Mother stated that she is patient caregiver and is in the home always. She stated that the patient has Agricultural consultant, Hydrologist. No Home O2,   Action/Plan: Plan is for discharge home with mother. No CM needs identified at this visit.  No concerns expressed by Mother.    Expected Discharge Date:  02/13/15               Expected Discharge Plan:  Home/Self Care  In-House Referral:     Discharge planning Services  CM Consult  Post Acute Care Choice:  NA Choice offered to:  NA  DME Arranged:    DME Agency:     HH Arranged:    HH Agency:     Status of Service:  In process, will continue to follow  Medicare Important Message Given:    Date Medicare IM Given:    Medicare IM give by:    Date Additional Medicare IM Given:    Additional Medicare Important Message give by:     If discussed at Long Length of Stay Meetings, dates discussed:    Additional Comments:  Adonis Huguenin, RN 02/13/2015, 9:03 AM

## 2015-02-13 NOTE — Progress Notes (Signed)
[name] PROGRESS NOTE  Amber Warner ZOX:[id number] DOB: 1965/09/13    PCP:   Josue Hector, MD   HPI: Amber Warner is an 50 y.o. female with hx of seizure, prior PE on coumadin, HLD, HTN, pulm HTN, admitted for tegretal toxicity with Level of 22.  This morning, her level dropped to 7.1, so she was given oral loading dose of 400mg  then resume her home dosing.  She had several small seizures today.    Rewiew of Systems:  Constitutional: Negative for malaise, fever and chills. No significant weight loss or weight gain Eyes: Negative for eye pain, redness and discharge, diplopia, visual changes, or flashes of light. ENMT: Negative for ear pain, hoarseness, nasal congestion, sinus pressure and sore throat. No headaches; tinnitus, drooling, or problem swallowing. Cardiovascular: Negative for chest pain, palpitations, diaphoresis, dyspnea and peripheral edema. ; No orthopnea, PND Respiratory: Negative for cough, hemoptysis, wheezing and stridor. No pleuritic chestpain. Gastrointestinal: Negative for nausea, vomiting, diarrhea, constipation, abdominal pain, melena, blood in stool, hematemesis, jaundice and rectal bleeding.    Genitourinary: Negative for frequency, dysuria, incontinence,flank pain and hematuria; Musculoskeletal: Negative for back pain and neck pain. Negative for swelling and trauma.;  Skin: . Negative for pruritus, rash, abrasions, bruising and skin lesion.; ulcerations Neuro: Negative for headache, lightheadedness and neck stiffness. Negative for weakness, altered level of consciousness , altered mental status, extremity weakness, burning feet, involuntary movement, seizure and syncope.  Psych: negative for anxiety, depression, insomnia, tearfulness, panic attacks, hallucinations, paranoia, suicidal or homicidal ideation   Past Medical History  Diagnosis Date  . Seizures (HCC)   . Pulmonary hypertension (HCC) 05/22/2013  . Bilateral pulmonary embolism  (HCC) 05/21/2013  . 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 Hyperlipidemia 05/25/2013  . 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 Hemorrhoid surgery    . Orif ankle fracture Left 02/06/2015    Procedure: OPEN REDUCTION INTERNAL FIXATION (ORIF) ANKLE FRACTURE;  Surgeon: Nadara Mustard, MD;  Location: [facility] OR;  Service: Orthopedics;  Laterality: Left;    Medications:  HOME MEDS: Prior to Admission medications   Medication Sig Start Date End Date Taking? Authorizing Provider  benzonatate (TESSALON) 100 MG capsule Take 1 capsule (100 mg total) by mouth 3 (three) times daily as needed for cough. 05/25/13  Yes Elliot Cousin, MD  CALCIUM-VITAMIN D PO Take 1 tablet by mouth daily.   Yes Historical Provider, MD  carBAMazepine (TEGRETOL) 100 MG/5ML suspension Take 100 mg by mouth 2 (two) times daily.   Yes Historical Provider, MD  carbamazepine (TEGRETOL) 200 MG tablet Take 200 mg by mouth 4 (four) times daily.   Yes Historical Provider, MD  ferrous sulfate 325 (65 FE) MG tablet Take 325 mg by mouth daily with breakfast.   Yes Historical Provider, MD  HYDROcodone-acetaminophen (NORCO/VICODIN) 5-325 MG tablet Take 1-2 tablets by mouth every 4 (four) hours as needed for moderate pain (breakthrough pain). 02/09/15  Yes Kathryne Hitch, MD  lamoTRIgine (LAMICTAL) 150 MG tablet Take 150 mg by mouth daily. 02/02/15  Yes Historical Provider, MD  MAGNESIUM PO Take 250 mg by mouth daily.    Yes Historical Provider, MD  Multiple Vitamin (MULTIVITAMIN WITH MINERALS) TABS tablet Take 1 tablet by mouth daily.   Yes Historical Provider, MD  nitrofurantoin (MACRODANTIN) 50 MG capsule Take 50 mg by mouth at bedtime. 01/16/15  Yes Historical Provider, MD  OVER THE COUNTER MEDICATION Take 2 tablets by  mouth 2 (two) times daily. Glucosamine Sulfate    Yes Historical Provider, MD  pantoprazole (PROTONIX) 40 MG tablet Take 40 mg by  mouth daily. 01/15/15  Yes Historical Provider, MD  potassium chloride SA (K-DUR,KLOR-CON) 20 MEQ tablet Take 20 mEq by mouth daily.   Yes Historical Provider, MD  Red Yeast Rice 600 MG CAPS Take 2 capsules by mouth at bedtime.   Yes Historical Provider, MD  vitamin C (ASCORBIC ACID) 500 MG tablet Take 500 mg by mouth daily.   Yes Historical Provider, MD  warfarin (COUMADIN) 5 MG tablet Take 5-10 mg by mouth daily. Take 5 mg by mouth on Mon, and Wed. Take 10 mg by mouth on Sun, Tue, Thur, Fri, Sat, Sun   Yes Historical Provider, MD  atorvastatin (LIPITOR) 10 MG tablet Take 1 tablet (10 mg total) by mouth daily at 6 PM. For treatment of your high cholesterol. Patient not taking: Reported on 02/05/2015 05/25/13   Elliot Cousin, MD  enoxaparin (LOVENOX) 150 MG/ML injection Inject 1 mL (150 mg total) into the skin daily. Patient not taking: Reported on 02/05/2015 05/25/13   Elliot Cousin, MD  lamoTRIgine (LAMICTAL) 25 MG tablet Take 1 tablet (25 mg total) by mouth 2 (two) times daily. New seizure medicine. Patient not taking: Reported on 02/05/2015 05/25/13   Elliot Cousin, MD     Allergies:  No Known Allergies  Social History:   reports that she has never smoked. She has never used smokeless tobacco. She reports that she does not drink alcohol or use illicit drugs.  Family History: Family History  Problem Relation Age of Onset  . Hypertension Mother   . Diabetes Mellitus II Maternal Aunt   . Stroke Father   . Heart attack Father 14    died at age 41     Physical Exam: Filed Vitals:   02/13/15 0909 02/13/15 1133 02/13/15 1356 02/13/15 1727  BP: 154/72 166/73 150/64 178/74  Pulse: 76 83 80 90  Temp: 97.8 F (36.6 C) 97.9 F (36.6 C) 98 F (36.7 C) 98.4 F (36.9 C)  TempSrc: Oral Oral Oral Axillary  Resp: SpO2: 92% 91% 94% 90%   Blood pressure 178/74, pulse 90, temperature 98.4 F (36.9 C), temperature source Axillary, resp. rate 18, last menstrual period 01/10/2015, SpO2  90 %.  GEN:  Pleasant patient lying in the stretcher in no acute distress; cooperative with exam. PSYCH:  alert and oriented x4; does not appear anxious or depressed; affect is appropriate. HEENT: Mucous membranes pink and anicteric; PERRLA; EOM intact; no cervical lymphadenopathy nor thyromegaly or carotid bruit; no JVD; There were no stridor. Neck is very supple. Breasts:: Not examined CHEST WALL: No tenderness CHEST: Normal respiration, clear to auscultation bilaterally.  HEART: Regular rate and rhythm.  There are no murmur, rub, or gallops.   BACK: No kyphosis or scoliosis; no CVA tenderness ABDOMEN: soft and non-tender; no masses, no organomegaly, normal abdominal bowel sounds; no pannus; no intertriginous candida. There is no rebound and no distention. Rectal Exam: Not done EXTREMITIES: No bone or joint deformity; age-appropriate arthropathy of the hands and knees; no edema; no ulcerations.  There is no calf tenderness. Genitalia: not examined PULSES: 2+ and symmetric SKIN: Normal hydration no rash or ulceration CNS: Cranial nerves 2-12 grossly intact no focal lateralizing neurologic deficit.  Speech is fluent; uvula elevated with phonation, facial symmetry and tongue midline. DTR are normal bilaterally, cerebella exam is intact, barbinski is negative and strengths are  equaled bilaterally.  No sensory loss.   Labs on Admission:  Basic Metabolic Panel:  Recent Labs Lab 02/12/15 1012 02/12/15 1017  NA 140 143  K 5.9* 4.5  CL 110 108  CO2  --  28  GLUCOSE 120* 121*  BUN 18 15  CREATININE 0.50 0.56  CALCIUM  --  8.8*   Liver Function Tests:  Recent Labs Lab 02/12/15 1017  AST 22  ALT 20  ALKPHOS 84  BILITOT 0.4  PROT 7.3  ALBUMIN 3.6   CBC:  Recent Labs Lab 02/12/15 1012 02/12/15 1017  WBC  --  10.4  NEUTROABS  --  9.3*  HGB 14.6 13.2  HCT 43.0 41.1  MCV  --  101.7*  PLT  --  162   Cardiac Enzymes: No results for input(s): CKTOTAL, CKMB, CKMBINDEX,  TROPONINI in the last 168 hours.  CBG:  Recent Labs Lab 02/12/15 0953  GLUCAP 107*     Radiological Exams on Admission: Ct Angio Head W/cm &/or Wo Cm  02/12/2015  CLINICAL DATA:  Headache and vomiting today. Slurred speech. Left-sided facial droop. Right eyelid will not open. Ataxia. The initial bolus of 75 mL contrast was nondiagnostic. A second bolus was performed. These images were diagnostic. EXAM: CT ANGIOGRAPHY HEAD TECHNIQUE: Multidetector CT imaging of the head was performed using the standard protocol during bolus administration of intravenous contrast. Multiplanar CT image reconstructions and MIPs were obtained to evaluate the vascular anatomy. CONTRAST:  150 mL OMNIPAQUE IOHEXOL 350 MG/ML SOLN COMPARISON:  CT head without contrast from the same day and 05/20/2013. FINDINGS: CT HEAD Brain: D source images demonstrate moderate periventricular white matter disease bilaterally. There is hyperdense irregularity along the ependyma bilaterally without significant interval change Calvarium and skull base: The calvarium is intact. The skullbase is unremarkable. Paranasal sinuses: The paranasal sinuses and mastoid air cells are clear. Orbits: Globes and orbits are intact. CTA HEAD Anterior circulation: Atherosclerotic calcifications are present within the cavernous internal carotid arteries without a significant stenosis. There is mild narrowing of proximal right A1 segment. The left A1 segment is within normal limits. No definite anterior communicating artery is present. There is mild narrowing in the a 2 segments and ACA branch vessels bilaterally. The MCA bifurcations are intact. There is moderate attenuation of proximal branch vessels on the left. More mild segmental narrowing is present on the right. Posterior circulation: The vertebral arteries are codominant. The PICA origins are visualized and normal. The basilar artery is within normal limits. Both posterior cerebral arteries originate from  the basilar tip. There is mild attenuation of distal PCA branch vessels bilaterally. Venous sinuses: The dural sinuses are patent. The left transverse sinus is dominant. The straight sinus and deep cerebral veins are intact. The cortical veins are unremarkable. Anatomic variants: None. Delayed phase:The postcontrast images demonstrate no pathologic enhancement. IMPRESSION: 1. Minimal atherosclerotic calcifications within the cavernous internal carotid arteries bilaterally without a significant stenosis. 2. Mild narrowing of proximal right A1 segment. 3. Mild narrowing within the a 2 segments, bilaterally ACA branch vessels, and bilateral MCA branch vessels bilaterally without a significant proximal stenosis. 4. Distal PCA branch vessel attenuation bilaterally. 5. 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 Electronically Signed   By: Marin Roberts M.D.   On: 02/12/2015 12:19   Ct Head Wo Contrast  02/12/2015  CLINICAL DATA:  Code stroke EXAM: CT HEAD WITHOUT CONTRAST TECHNIQUE: Contiguous axial images were obtained from the base of the skull through the vertex  without intravenous contrast. COMPARISON:  05/20/2013 FINDINGS: No skull fracture is noted. Paranasal sinuses and mastoid air cells are unremarkable. Stable patchy decreased attenuation periventricular and deep white matter bilateral cerebral hemispheres consistent with chronic small vessel ischemic changes. No definite acute cortical infarction. Again noted nodularity along the edges of lateral ventricles bilaterally suspicious for migration abnormality. Stable cerebellar atrophy. No mass lesion is noted on this unenhanced scan. Ventricular size is stable from prior exam. There is no intraventricular hemorrhage. IMPRESSION: No acute intracranial abnormality. No definite acute cortical infarction. Stable chronic findings as described above. Stable cerebellar atrophy. These results were called by telephone at  the time of interpretation on 02/12/2015 at 10:13 am to Dr. Rolland Porter , who verbally acknowledged these results. Electronically Signed   By: Natasha Mead M.D.   On: 02/12/2015 10:14    Assessment/Plan Present on Admission:  . Tegretol toxicity . Morbid obesity (HCC)  Hx of PE on Coumadin.  PLAN:   Tegretal toxicity:  Now subtherapeutic and having seizures.  Will continue to monitor level. Hx of PE:  Will resume her coumadin, dosing per pharmacy.  Other plans as per orders.  Code Status: FULL Unk Lightning, MD.  FACP [name] Pager [phone] 7pm to 7am.  02/13/2015, 6:05 PM

## 2015-02-13 NOTE — Progress Notes (Addendum)
   02/13/15 1133  Vitals  Temp 97.9 F (36.6 C)  Temp Source Oral  BP (!) 166/73 mmHg  BP Location Left Arm  BP Method Automatic  Patient Position (if appropriate) Lying  Pulse Rate 83  Pulse Rate Source Dinamap  Resp 18  Oxygen Therapy  SpO2 91 %  O2 Device Room Air   Pt had another seizure per mother at bedside, not witnessed by this nurse.  VSS and pt Alert and Oriented upon arrival to room.  Will continue to monitor. Dr Conley Rolls notified.

## 2015-02-13 NOTE — Progress Notes (Addendum)
   02/13/15 1727  Vitals  Temp 98.4 F (36.9 C)  Temp Source Axillary  BP (!) 178/74 mmHg  MAP (mmHg) 100  BP Location Left Arm  BP Method Automatic  Patient Position (if appropriate) Sitting  Pulse Rate 90  Pulse Rate Source Dinamap  Resp 18  Oxygen Therapy  SpO2 90 %  O2 Device Room Air      Pt had another seizure per mother at bedside, not witnessed by this nurse, third event this shift. VSS and pt Alert and Oriented upon arrival to room. Will continue to monitor. Dr Conley Rolls notified.

## 2015-02-13 NOTE — Progress Notes (Signed)
ANTICOAGULATION CONSULT NOTE - Initial Consult  Pharmacy Consult for Warfarin Indication: VTE treatment, history of PE  No Known Allergies  Patient Measurements:   Heparin Dosing Weight:   Vital Signs: Temp: 98.4 F (36.9 C) (01/20 1727) Temp Source: Axillary (01/20 1727) BP: 178/74 mmHg (01/20 1727) Pulse Rate: 90 (01/20 1727)  Labs:  Recent Labs  02/12/15 1012 02/12/15 1017 02/13/15 2012  HGB 14.6 13.2  --   HCT 43.0 41.1  --   PLT  --  162  --   APTT  --  30  --   LABPROT  --  21.5* 16.6*  INR  --  1.88* 1.33  CREATININE 0.50 0.56  --     Estimated Creatinine Clearance: 104.5 mL/min (by C-G formula based on Cr of 0.56).   Medical History: Past Medical History  Diagnosis Date  . Seizures (HCC)   . Pulmonary hypertension (HCC) 05/22/2013  . Bilateral pulmonary embolism (HCC) 05/21/2013  . 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 Hyperlipidemia 05/25/2013  . 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 benzonatate (TESSALON) 100 MG capsule Take 1 capsule (100 mg total) by mouth 3 (three) times daily as needed for cough. 20 capsule 0 Past Month at Unknown time  . CALCIUM-VITAMIN D PO Take 1 tablet by mouth daily.   02/11/2015 at Unknown time  . carBAMazepine (TEGRETOL) 100 MG/5ML suspension Take 100 mg by mouth 2 (two) times daily.   02/12/2015 at Unknown time  . carbamazepine (TEGRETOL) 200 MG tablet Take 200 mg by mouth 4 (four) times daily.   02/12/2015 at Unknown time  . ferrous sulfate 325 (65 FE) MG tablet Take 325 mg by mouth daily with breakfast.   02/12/2015 at Unknown time  . HYDROcodone-acetaminophen (NORCO/VICODIN) 5-325 MG tablet Take 1-2 tablets by mouth every 4 (four) hours as needed for moderate pain (breakthrough pain). 60 tablet 0 02/11/2015 at Unknown time  . lamoTRIgine (LAMICTAL) 150 MG tablet Take 150 mg by mouth daily.   02/12/2015  at Unknown time  . MAGNESIUM PO Take 250 mg by mouth daily.    02/11/2015 at Unknown time  . Multiple Vitamin (MULTIVITAMIN WITH MINERALS) TABS tablet Take 1 tablet by mouth daily.   02/11/2015 at Unknown time  . nitrofurantoin (MACRODANTIN) 50 MG capsule Take 50 mg by mouth at bedtime.  3 02/12/2015 at Unknown time  . OVER THE COUNTER MEDICATION Take 2 tablets by mouth 2 (two) times daily. Glucosamine Sulfate    02/11/2015 at Unknown time  . pantoprazole (PROTONIX) 40 MG tablet Take 40 mg by mouth daily.  5 02/11/2015 at Unknown time  . potassium chloride SA (K-DUR,KLOR-CON) 20 MEQ tablet Take 20 mEq by mouth daily.   02/12/2015 at Unknown time  . Red Yeast Rice 600 MG CAPS Take 2 capsules by mouth at bedtime.   02/11/2015 at Unknown time  . vitamin C (ASCORBIC ACID) 500 MG tablet Take 500 mg by mouth daily.   02/11/2015 at Unknown time  . warfarin (COUMADIN) 5 MG tablet Take 5-10 mg by mouth daily. Take 5 mg by mouth on Mon, and Wed. Take 10 mg by mouth on Sun, Tue, Thur, Fri, Sat, Sun   02/11/2015 at 1800  . atorvastatin (LIPITOR) 10 MG tablet Take 1 tablet (10 mg total) by mouth daily at 6 PM. For treatment of your high cholesterol. (Patient not taking: Reported on 02/05/2015) 30  tablet 2 Not Taking at Unknown time  . enoxaparin (LOVENOX) 150 MG/ML injection Inject 1 mL (150 mg total) into the skin daily. (Patient not taking: Reported on 02/05/2015) 30 Syringe 6 Not Taking at Unknown time  . lamoTRIgine (LAMICTAL) 25 MG tablet Take 1 tablet (25 mg total) by mouth 2 (two) times daily. New seizure medicine. (Patient not taking: Reported on 02/05/2015) 60 tablet 3 Not Taking at Unknown time    Assessment: Continuation of Warfarin PTA for history of PE INR sub therapeutic on admission  Goal of Therapy:  INR 2-3 Monitor platelets by anticoagulation protocol: Yes   Plan:  Warfarin 10 mg po x 1 dose tonight INR/PT daily Monitor CBC/platlets, signs of bleeding  Raquel James, Elodie Panameno  Bennett 02/13/2015,9:15 PM

## 2015-02-14 DIAGNOSIS — G40909 Epilepsy, unspecified, not intractable, without status epilepticus: Secondary | ICD-10-CM | POA: Diagnosis not present

## 2015-02-14 LAB — CBC
HCT: 38.6 % (ref 36.0–46.0)
Hemoglobin: 12.6 g/dL (ref 12.0–15.0)
MCH: 32.4 pg (ref 26.0–34.0)
MCHC: 32.6 g/dL (ref 30.0–36.0)
MCV: 99.2 fL (ref 78.0–100.0)
PLATELETS: 175 10*3/uL (ref 150–400)
RBC: 3.89 MIL/uL (ref 3.87–5.11)
RDW: 12.5 % (ref 11.5–15.5)
WBC: 5.7 10*3/uL (ref 4.0–10.5)

## 2015-02-14 LAB — PROTIME-INR
INR: 1.23 (ref 0.00–1.49)
PROTHROMBIN TIME: 15.6 s — AB (ref 11.6–15.2)

## 2015-02-14 MED ORDER — ALUM & MAG HYDROXIDE-SIMETH 200-200-20 MG/5ML PO SUSP
30.0000 mL | ORAL | Status: DC | PRN
  Administered 2015-02-14: 30 mL via ORAL
  Filled 2015-02-14: qty 30

## 2015-02-14 MED ORDER — WARFARIN - PHARMACIST DOSING INPATIENT: Status: DC

## 2015-02-14 MED ORDER — WARFARIN SODIUM 5 MG PO TABS: 10.0000 mg | ORAL_TABLET | Freq: Once | ORAL | Status: DC

## 2015-02-14 NOTE — Progress Notes (Signed)
Patient with orders to be discharge home. Discharge instructions given, patient's mother verbalized understanding. Patient stable. Patient left in private vehicle with mother.

## 2015-02-14 NOTE — Progress Notes (Signed)
Patient had no seizure activity during the night, although has complaints of a small headache this am. No other issues at this time. Will continue to monitor patient.

## 2015-02-14 NOTE — Progress Notes (Signed)
ANTICOAGULATION CONSULT NOTE -Follow up  Pharmacy Consult for Warfarin Indication: VTE treatment, history of PE  No Known Allergies  Patient Measurements:   Heparin Dosing Weight:   Vital Signs: Temp: 98.2 F (36.8 C) (01/21 0436) Temp Source: Oral (01/21 0436) BP: 138/65 mmHg (01/21 0436) Pulse Rate: 76 (01/21 0436)  Labs:  Recent Labs  02/12/15 1012 02/12/15 1017 02/13/15 2012 02/14/15 0635  HGB 14.6 13.2  --  12.6  HCT 43.0 41.1  --  38.6  PLT  --  162  --  175  APTT  --  30  --   --   LABPROT  --  21.5* 16.6* 15.6*  INR  --  1.88* 1.33 1.23  CREATININE 0.50 0.56  --   --     Estimated Creatinine Clearance: 104.5 mL/min (by C-G formula based on Cr of 0.56).   Medical History: Past Medical History  Diagnosis Date  . Seizures (HCC)   . Pulmonary hypertension (HCC) 05/22/2013  . Bilateral pulmonary embolism (HCC) 05/21/2013  . 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 Hyperlipidemia 05/25/2013  . 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 benzonatate (TESSALON) 100 MG capsule Take 1 capsule (100 mg total) by mouth 3 (three) times daily as needed for cough. 20 capsule 0 Past Month at Unknown time  . CALCIUM-VITAMIN D PO Take 1 tablet by mouth daily.   02/11/2015 at Unknown time  . carBAMazepine (TEGRETOL) 100 MG/5ML suspension Take 100 mg by mouth 2 (two) times daily.   02/12/2015 at Unknown time  . carbamazepine (TEGRETOL) 200 MG tablet Take 200 mg by mouth 4 (four) times daily.   02/12/2015 at Unknown time  . ferrous sulfate 325 (65 FE) MG tablet Take 325 mg by mouth daily with breakfast.   02/12/2015 at Unknown time  . HYDROcodone-acetaminophen (NORCO/VICODIN) 5-325 MG tablet Take 1-2 tablets by mouth every 4 (four) hours as needed for moderate pain (breakthrough pain). 60 tablet 0 02/11/2015 at Unknown time  . lamoTRIgine (LAMICTAL) 150 MG tablet  Take 150 mg by mouth daily.   02/12/2015 at Unknown time  . MAGNESIUM PO Take 250 mg by mouth daily.    02/11/2015 at Unknown time  . Multiple Vitamin (MULTIVITAMIN WITH MINERALS) TABS tablet Take 1 tablet by mouth daily.   02/11/2015 at Unknown time  . nitrofurantoin (MACRODANTIN) 50 MG capsule Take 50 mg by mouth at bedtime.  3 02/12/2015 at Unknown time  . OVER THE COUNTER MEDICATION Take 2 tablets by mouth 2 (two) times daily. Glucosamine Sulfate    02/11/2015 at Unknown time  . pantoprazole (PROTONIX) 40 MG tablet Take 40 mg by mouth daily.  5 02/11/2015 at Unknown time  . potassium chloride SA (K-DUR,KLOR-CON) 20 MEQ tablet Take 20 mEq by mouth daily.   02/12/2015 at Unknown time  . Red Yeast Rice 600 MG CAPS Take 2 capsules by mouth at bedtime.   02/11/2015 at Unknown time  . vitamin C (ASCORBIC ACID) 500 MG tablet Take 500 mg by mouth daily.   02/11/2015 at Unknown time  . warfarin (COUMADIN) 5 MG tablet Take 5-10 mg by mouth daily. Take 5 mg by mouth on Mon, and Wed. Take 10 mg by mouth on Sun, Tue, Thur, Fri, Sat, Sun   02/11/2015 at 1800  . atorvastatin (LIPITOR) 10 MG tablet Take 1 tablet (10 mg total) by mouth daily at 6 PM. For  treatment of your high cholesterol. (Patient not taking: Reported on 02/05/2015) 30 tablet 2 Not Taking at Unknown time  . enoxaparin (LOVENOX) 150 MG/ML injection Inject 1 mL (150 mg total) into the skin daily. (Patient not taking: Reported on 02/05/2015) 30 Syringe 6 Not Taking at Unknown time  . lamoTRIgine (LAMICTAL) 25 MG tablet Take 1 tablet (25 mg total) by mouth 2 (two) times daily. New seizure medicine. (Patient not taking: Reported on 02/05/2015) 60 tablet 3 Not Taking at Unknown time    Assessment: Continuation of Warfarin PTA for history of PE INR sub therapeutic on admission INR remains sub therapeutic, per PTA medication list appears dose missed 02/12/15 No signs of bleeding  Goal of Therapy:  INR 2-3 Monitor platelets by anticoagulation protocol:  Yes   Plan:  Warfarin 10 mg po x 1 dose tonight INR/PT daily Monitor CBC/platlets, signs of bleeding  Raquel James, Lavelle Berland Bennett 02/14/2015,8:37 AM

## 2015-02-14 NOTE — Discharge Summary (Signed)
Physician Discharge Summary  Amber Warner NWG:[id number] DOB: [birth year] DOA: 02/12/2015  PCP: Josue Hector, MD  Admit date: 02/12/2015 Discharge date: 02/14/2015  Time spent: 35 minutes  Recommendations for Outpatient Follow-up:  1. Follow up with PCP next week. 2. Follow up with neurology as scheduled.     Discharge Diagnoses:  Principal Problem:   Tegretol toxicity Active Problems:   Seizure disorder (HCC)   Morbid obesity (HCC)   Discharge Condition: Improved.  Alert, no slurred speech, no further seizure.   Diet recommendation: as tolerated.    History of present illness: patient was admitted as a code stroke, but turned out to be Tegretol toxicity by me on Feb 12, 2015.  As per my H and P:  " Amber Warner is an 50 y.o. female with hx of seizure, prior pulmonary embolism on Coumadin, HLD, Pulm HTN, brought to the ER for ataxia and slurred speech as a code stroke. In the ER, she was originally less responsive, but became more responsive. Evaluation was found to have toxic level of Tegretol at 22.8 (normal 4-12), with CT head showing stable cerebella atropy. Neuro consult over the phone recommended CTA of the brain, which showed non significant stenosis. Serology was unremarkable with normal Cr and no leukocytosis, with normal Hb. EKG showed NSR with no QTc prolongation. She became more responsive, conversing, and Code Stroke was cancelled.   Hospital Course: Patient was admitted into telemetry.  Her EKG did not show any QTc prolongation.  As soon as she was in her room, she became alert, orient and conversing.  Her Tegretol medication was held.  Unfortunately, she was having multiple little seizures.  Her Tegretol was rechecked, and it was low at 7.1.  There was no IV tegretol in the formulary, so she was given  of Tegretol loading, and her home dose was continued.  It was not changed as she had been stable on her dose, and the toxicity was felt to be  due to error in her taking her meds.  Her mother volunteered to manage her meds from now on.  Her mother was an Charity fundraiser with the [employer] and had taken care of her for years with her seizure.  She no longer had seizure.   Her free and total Tegretol level was obtained, but had to be sent out.  She doesn't need to be in the hospital for the result, so she will be sent home with her prior dose of Tegretol.  Thank you and good day.   Discharge Exam: Filed Vitals:   02/13/15 2116 02/14/15 0436  BP: 148/75 138/65  Pulse: 78 76  Temp: 98.2 F (36.8 C) 98.2 F (36.8 C)  Resp: 18 18    Discharge Instructions    Diet - low sodium heart healthy    Complete by:  As directed      Discharge instructions    Complete by:  As directed   Please resume medications as before.  Please have mother dispensed her medication.  Follow up with neurologist in 1 week.  Follow up with PCP as scheduled.     Increase activity slowly    Complete by:  As directed           Current Discharge Medication List    CONTINUE these medications which have NOT CHANGED   Details  benzonatate (TESSALON) 100 MG capsule Take 1 capsule (100 mg total) by mouth 3 (three) times daily as needed for cough. Qty: 20 capsule, Refills:  0    CALCIUM-VITAMIN D PO Take 1 tablet by mouth daily.    carBAMazepine (TEGRETOL) 100 MG/5ML suspension Take 100 mg by mouth 2 (two) times daily.    carbamazepine (TEGRETOL) 200 MG tablet Take 200 mg by mouth 4 (four) times daily.    ferrous sulfate 325 (65 FE) MG tablet Take 325 mg by mouth daily with breakfast.    HYDROcodone-acetaminophen (NORCO/VICODIN) 5-325 MG tablet Take 1-2 tablets by mouth every 4 (four) hours as needed for moderate pain (breakthrough pain). Qty: 60 tablet, Refills: 0    lamoTRIgine (LAMICTAL) 150 MG tablet Take 150 mg by mouth daily.    MAGNESIUM PO Take 250 mg by mouth daily.     Multiple Vitamin (MULTIVITAMIN WITH MINERALS) TABS tablet Take 1 tablet by mouth daily.     nitrofurantoin (MACRODANTIN) 50 MG capsule Take 50 mg by mouth at bedtime. Refills: 3    pantoprazole (PROTONIX) 40 MG tablet Take 40 mg by mouth daily. Refills: 5    potassium chloride SA (K-DUR,KLOR-CON) 20 MEQ tablet Take 20 mEq by mouth daily.    Red Yeast Rice 600 MG CAPS Take 2 capsules by mouth at bedtime.    vitamin C (ASCORBIC ACID) 500 MG tablet Take 500 mg by mouth daily.    warfarin (COUMADIN) 5 MG tablet Take 5-10 mg by mouth daily. Take 5 mg by mouth on Mon, and Wed. Take 10 mg by mouth on Sun, Tue, Thur, Fri, Sat, Sun    atorvastatin (LIPITOR) 10 MG tablet Take 1 tablet (10 mg total) by mouth daily at 6 PM. For treatment of your high cholesterol. Qty: 30 tablet, Refills: 2      STOP taking these medications     OVER THE COUNTER MEDICATION      enoxaparin (LOVENOX) 150 MG/ML injection        No Known Allergies    The results of significant diagnostics from this hospitalization (including imaging, microbiology, ancillary and laboratory) are listed below for reference.    Significant Diagnostic Studies: Ct Angio Head W/cm &/or Wo Cm  02/12/2015  CLINICAL DATA:  Headache and vomiting today. Slurred speech. Left-sided facial droop. Right eyelid will not open. Ataxia. The initial bolus of 75 mL contrast was nondiagnostic. A second bolus was performed. These images were diagnostic. EXAM: CT ANGIOGRAPHY HEAD TECHNIQUE: Multidetector CT imaging of the head was performed using the standard protocol during bolus administration of intravenous contrast. Multiplanar CT image reconstructions and MIPs were obtained to evaluate the vascular anatomy. CONTRAST:  150 mL OMNIPAQUE IOHEXOL 350 MG/ML SOLN COMPARISON:  CT head without contrast from the same day and 05/20/2013. FINDINGS: CT HEAD Brain: D source images demonstrate moderate periventricular white matter disease bilaterally. There is hyperdense irregularity along the ependyma bilaterally without significant interval change  Calvarium and skull base: The calvarium is intact. The skullbase is unremarkable. Paranasal sinuses: The paranasal sinuses and mastoid air cells are clear. Orbits: Globes and orbits are intact. CTA HEAD Anterior circulation: Atherosclerotic calcifications are present within the cavernous internal carotid arteries without a significant stenosis. There is mild narrowing of proximal right A1 segment. The left A1 segment is within normal limits. No definite anterior communicating artery is present. There is mild narrowing in the a 2 segments and ACA branch vessels bilaterally. The MCA bifurcations are intact. There is moderate attenuation of proximal branch vessels on the left. More mild segmental narrowing is present on the right. Posterior circulation: The vertebral arteries are codominant. The PICA origins are  visualized and normal. The basilar artery is within normal limits. Both posterior cerebral arteries originate from the basilar tip. There is mild attenuation of distal PCA branch vessels bilaterally. Venous sinuses: The dural sinuses are patent. The left transverse sinus is dominant. The straight sinus and deep cerebral veins are intact. The cortical veins are unremarkable. Anatomic variants: None. Delayed phase:The postcontrast images demonstrate no pathologic enhancement. IMPRESSION: 1. Minimal atherosclerotic calcifications within the cavernous internal carotid arteries bilaterally without a significant stenosis. 2. Mild narrowing of proximal right A1 segment. 3. Mild narrowing within the a 2 segments, bilaterally ACA branch vessels, and bilateral MCA branch vessels bilaterally without a significant proximal stenosis. 4. Distal PCA branch vessel attenuation bilaterally. 5. 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 Electronically Signed   By: Marin Roberts M.D.   On: 02/12/2015 12:19   Dg Chest 2 View  02/06/2015  CLINICAL DATA:  Ankle fracture, pulmonary  hypertension EXAM: CHEST  2 VIEW COMPARISON:  05/23/2013 and 05/20/2013 FINDINGS: Borderline cardiomegaly. The study is limited by patient's large body habitus. Central mild vascular congestion. Mild perihilar interstitial prominence. Mild interstitial edema cannot be excluded. No segmental infiltrates. Mild degenerative changes thoracic spine. IMPRESSION: Cardiomegaly. Central mild vascular congestion mild perihilar interstitial prominence. Mild interstitial edema cannot be excluded. No segmental infiltrates. Electronically Signed   By: Natasha Mead M.D.   On: 02/06/2015 12:40   Dg Tibia/fibula Left  01/29/2015  CLINICAL DATA:  Recent distal fibular fracture with immobilization EXAM: LEFT TIBIA AND FIBULA - 2 VIEW COMPARISON:  January 27, 2015 left ankle images including distal tibia and fibula FINDINGS: Frontal and lateral views obtained. The comminuted fracture of the distal fibular diaphysis is again noted with mild medial and posterior displacement of the major distal fracture fragment with respect proximal fragment. The alignment is essentially stable compared to 2 days prior. The ankle mortise appears intact. No new fracture. No appreciable joint effusion. There are inferior and posterior calcaneal spurs. IMPRESSION: No appreciable change in the alignment of the distal fibular fracture as noted above. No new fracture. The ankle mortise appears intact. Electronically Signed   By: Bretta Bang III M.D.   On: 01/29/2015 13:52   Dg Ankle Complete Left  01/27/2015  CLINICAL DATA:  Injury EXAM: LEFT ANKLE COMPLETE - 3+ VIEW COMPARISON:  None. FINDINGS: There is an oblique fracture of the distal fibula, well above the tibial plafond. There is cortical step-off along the tibial plafond worrisome for an intra-articular fracture of the distal tibia. Tiny fragments project inferior to the medial malleolus worrisome for avulsion fractures. The fracture of the posterior malleolus is visualized on the lateral view.  Spurring of the inferior and posterior calcaneus. IMPRESSION: Distal fibular fracture above the tibial plafond. Intra-articular distal tibia fracture as described. Weber C injury. Electronically Signed   By: Jolaine Click M.D.   On: 01/27/2015 15:38   Ct Head Wo Contrast  02/12/2015  CLINICAL DATA:  Code stroke EXAM: CT HEAD WITHOUT CONTRAST TECHNIQUE: Contiguous axial images were obtained from the base of the skull through the vertex without intravenous contrast. COMPARISON:  05/20/2013 FINDINGS: No skull fracture is noted. Paranasal sinuses and mastoid air cells are unremarkable. Stable patchy decreased attenuation periventricular and deep white matter bilateral cerebral hemispheres consistent with chronic small vessel ischemic changes. No definite acute cortical infarction. Again noted nodularity along the edges of lateral ventricles bilaterally suspicious for migration abnormality. Stable cerebellar atrophy. No mass lesion is noted on this unenhanced  scan. Ventricular size is stable from prior exam. There is no intraventricular hemorrhage. IMPRESSION: No acute intracranial abnormality. No definite acute cortical infarction. Stable chronic findings as described above. Stable cerebellar atrophy. These results were called by telephone at the time of interpretation on 02/12/2015 at 10:13 am to Dr. Rolland Porter , who verbally acknowledged these results. Electronically Signed   By: Natasha Mead M.D.   On: 02/12/2015 10:14   Dg Foot Complete Left  01/27/2015  CLINICAL DATA:  Fall, ankle pain. Swelling throughout left ankle. Pain and swelling extends into the left foot. EXAM: LEFT FOOT - COMPLETE 3+ VIEW COMPARISON:  Ankle series performed today. FINDINGS: Small avulsed fragments noted off the tip of the medial malleolus. The distal fibular fracture is partially imaged, better seen on the ankle series. No acute bony abnormality within the foot. Plantar calcaneal spur. IMPRESSION: Distal fibular fracture and avulsion off  the tip of the medial malleolus again noted, better seen on the ankle series. No acute bony abnormality within the foot. Electronically Signed   By: Charlett Nose M.D.   On: 01/27/2015 15:38    Microbiology: Recent Results (from the past 240 hour(s))  Surgical pcr screen     Status: Abnormal   Collection Time: 02/06/15 12:45 PM  Result Value Ref Range Status   MRSA, PCR NEGATIVE NEGATIVE Final   Staphylococcus aureus POSITIVE (A) NEGATIVE Final    Comment:        The Xpert SA Assay (FDA approved for NASAL specimens in patients over 51 years of age), is one component of a comprehensive surveillance program.  Test performance has been validated by [facility] for patients greater than or equal to 106 year old. It is not intended to diagnose infection nor to guide or monitor treatment.      Labs: Basic Metabolic Panel:  Recent Labs Lab 02/12/15 1012 02/12/15 1017  NA 140 143  K 5.9* 4.5  CL 110 108  CO2  --  28  GLUCOSE 120* 121*  BUN 18 15  CREATININE 0.50 0.56  CALCIUM  --  8.8*   Liver Function Tests:  Recent Labs Lab 02/12/15 1017  AST 22  ALT 20  ALKPHOS 84  BILITOT 0.4  PROT 7.3  ALBUMIN 3.6   No results for input(s): LIPASE, AMYLASE in the last 168 hours. No results for input(s): AMMONIA in the last 168 hours. CBC:  Recent Labs Lab 02/12/15 1012 02/12/15 1017 02/14/15 0635  WBC  --  10.4 5.7  NEUTROABS  --  9.3*  --   HGB 14.6 13.2 12.6  HCT 43.0 41.1 38.6  MCV  --  101.7* 99.2  PLT  --  162 175   CBG:  Recent Labs Lab 02/12/15 0953  GLUCAP 107*   Signed:  Keyaria Lawson MD.  [name] 02/14/2015, 2:11 PM

## 2015-02-16 LAB — CARBAMAZEPINE, FREE AND TOTAL
CARBAMAZEPINE FREE: 2.3 ug/mL (ref 0.6–4.2)
CARBAMAZEPINE, TOTAL: 8.6 ug/mL (ref 4.0–12.0)

## 2016-10-30 IMAGING — CT CT ANGIO HEAD
1 of 16 series · 2 of 47 positions shown · IV contrast (Omnipaque 300)
Comparison: CT head without contrast from the same day and
05/20/2013.

CLINICAL DATA: Headache and vomiting today. Slurred speech.
Left-sided facial droop. Right eyelid will not open. Ataxia.

The initial bolus of 75 mL contrast was nondiagnostic. A second
bolus was performed. These images were diagnostic.
EXAM:
CT ANGIOGRAPHY HEAD
TECHNIQUE: Multidetector CT imaging of the head was performed using the
standard protocol during bolus administration of intravenous
contrast. Multiplanar CT image reconstructions and MIPs were
obtained to evaluate the vascular anatomy.
CONTRAST:  150 mL OMNIPAQUE IOHEXOL 350 MG/ML SOLN

[Series 7: brain angio 2.0 pacs · axial · 0.41mm/px · z∈[+1040,+1096]mm · 2 of 86 slices shown]
[im 29/86  brain]
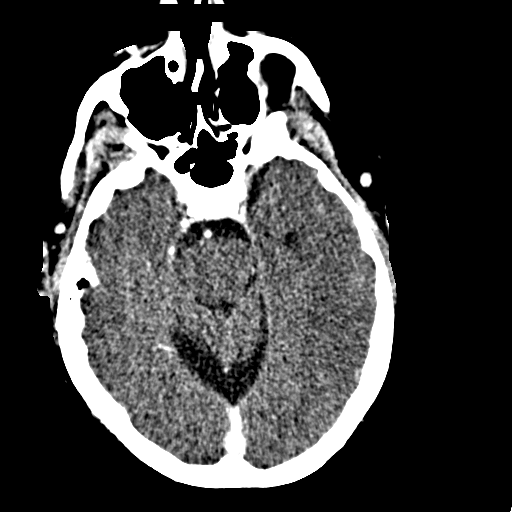
[im 57/86  bone]
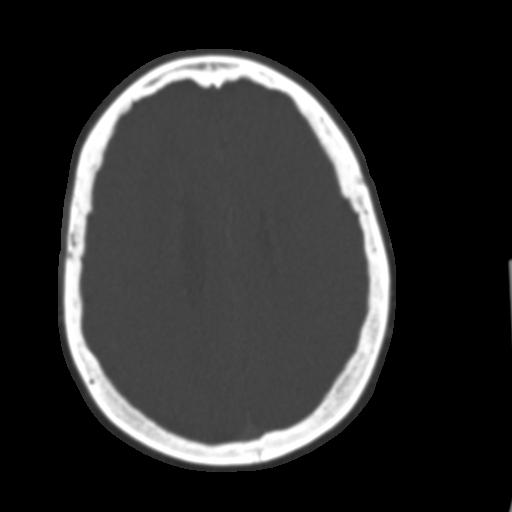

[2 of 47 positions shown; findings below may reference images not displayed]

FINDINGS: CT HEAD

Brain: D source images demonstrate moderate periventricular white
matter disease bilaterally. There is hyperdense irregularity along
the ependyma bilaterally without significant interval change

Calvarium and skull base: The calvarium is intact. The skullbase is
unremarkable.

Paranasal sinuses: The paranasal sinuses and mastoid air cells are
clear.

Orbits: Globes and orbits are intact.

CTA HEAD

Anterior circulation: Atherosclerotic calcifications are present
within the cavernous internal carotid arteries without a significant
stenosis. There is mild narrowing of proximal right A1 segment. The
left A1 segment is within normal limits. No definite anterior
communicating artery is present. There is mild narrowing in the a 2
segments and ACA branch vessels bilaterally. The MCA bifurcations
are intact. There is moderate attenuation of proximal branch vessels
on the left. More mild segmental narrowing is present on the right.

Posterior circulation: The vertebral arteries are codominant. The
PICA origins are visualized and normal. The basilar artery is within
normal limits. Both posterior cerebral arteries originate from the
basilar tip. There is mild attenuation of distal PCA branch vessels
bilaterally.

Venous sinuses: The dural sinuses are patent. The left transverse
sinus is dominant. The straight sinus and deep cerebral veins are
intact. The cortical veins are unremarkable.

Anatomic variants: None.

Delayed phase:The postcontrast images demonstrate no pathologic
enhancement.
IMPRESSION: 1. Minimal atherosclerotic calcifications within the cavernous
internal carotid arteries bilaterally without a significant
stenosis.
2. Mild narrowing of proximal right A1 segment.
3. Mild narrowing within the a 2 segments, bilaterally ACA branch
vessels, and bilateral MCA branch vessels bilaterally without a
significant proximal stenosis.
4. Distal PCA branch vessel attenuation bilaterally.
5. Heterotopic gray matter along the lateral ventricles bilaterally,
a chronic finding.

## 2017-09-13 ENCOUNTER — Inpatient Hospital Stay: Payer: Medicare Other | Attending: Hematology & Oncology

## 2017-09-13 ENCOUNTER — Ambulatory Visit: Payer: Medicare Other | Admitting: Family

## 2017-09-13 ENCOUNTER — Other Ambulatory Visit: Payer: Self-pay | Admitting: Family

## 2017-09-13 DIAGNOSIS — Z7901 Long term (current) use of anticoagulants: Secondary | ICD-10-CM

## 2017-09-13 DIAGNOSIS — I2699 Other pulmonary embolism without acute cor pulmonale: Secondary | ICD-10-CM

## 2017-09-24 DEATH — deceased
